# Patient Record
Sex: Male | Born: 1950 | Race: White | Hispanic: No | Marital: Married | Smoking: Current every day smoker
Health system: Southern US, Community
[De-identification: ages and names within clinical notes are randomized; demographics above are authoritative.]

## PROBLEM LIST (undated history)

## (undated) DIAGNOSIS — I4510 Unspecified right bundle-branch block: Secondary | ICD-10-CM

## (undated) DIAGNOSIS — E7800 Pure hypercholesterolemia, unspecified: Secondary | ICD-10-CM

## (undated) DIAGNOSIS — Z972 Presence of dental prosthetic device (complete) (partial): Secondary | ICD-10-CM

## (undated) DIAGNOSIS — K8020 Calculus of gallbladder without cholecystitis without obstruction: Secondary | ICD-10-CM

## (undated) DIAGNOSIS — K219 Gastro-esophageal reflux disease without esophagitis: Secondary | ICD-10-CM

## (undated) HISTORY — PX: THORACIC DISC SURGERY: SHX801

---

## 2005-03-25 ENCOUNTER — Encounter: Admission: RE | Admit: 2005-03-25 | Discharge: 2005-03-25 | Payer: Self-pay | Admitting: Internal Medicine

## 2011-04-03 ENCOUNTER — Other Ambulatory Visit: Payer: Self-pay | Admitting: Cardiology

## 2011-04-03 ENCOUNTER — Ambulatory Visit
Admission: RE | Admit: 2011-04-03 | Discharge: 2011-04-03 | Disposition: A | Discharge status: Home / Self Care | Payer: BC Managed Care – PPO | Source: Ambulatory Visit | Attending: Cardiology | Admitting: Cardiology

## 2014-02-08 ENCOUNTER — Other Ambulatory Visit: Payer: Self-pay | Admitting: Family Medicine

## 2014-02-08 DIAGNOSIS — M545 Low back pain: Secondary | ICD-10-CM

## 2014-02-19 ENCOUNTER — Ambulatory Visit
Admission: RE | Admit: 2014-02-19 | Discharge: 2014-02-19 | Disposition: A | Discharge status: Home / Self Care | Payer: BLUE CROSS/BLUE SHIELD | Source: Ambulatory Visit | Attending: Family Medicine | Admitting: Family Medicine

## 2014-02-19 DIAGNOSIS — M545 Low back pain: Secondary | ICD-10-CM

## 2014-12-11 ENCOUNTER — Emergency Department (HOSPITAL_BASED_OUTPATIENT_CLINIC_OR_DEPARTMENT_OTHER): Payer: BLUE CROSS/BLUE SHIELD

## 2014-12-11 ENCOUNTER — Emergency Department (HOSPITAL_BASED_OUTPATIENT_CLINIC_OR_DEPARTMENT_OTHER)
Admission: EM | Admit: 2014-12-11 | Discharge: 2014-12-11 | Disposition: A | Discharge status: Home / Self Care | Payer: BLUE CROSS/BLUE SHIELD | Attending: Emergency Medicine | Admitting: Emergency Medicine

## 2014-12-11 ENCOUNTER — Encounter (HOSPITAL_BASED_OUTPATIENT_CLINIC_OR_DEPARTMENT_OTHER): Payer: Self-pay | Admitting: Emergency Medicine

## 2014-12-11 DIAGNOSIS — Z8679 Personal history of other diseases of the circulatory system: Secondary | ICD-10-CM | POA: Insufficient documentation

## 2014-12-11 DIAGNOSIS — R509 Fever, unspecified: Secondary | ICD-10-CM | POA: Diagnosis not present

## 2014-12-11 DIAGNOSIS — R079 Chest pain, unspecified: Secondary | ICD-10-CM | POA: Diagnosis present

## 2014-12-11 DIAGNOSIS — R42 Dizziness and giddiness: Secondary | ICD-10-CM | POA: Diagnosis not present

## 2014-12-11 DIAGNOSIS — Z79899 Other long term (current) drug therapy: Secondary | ICD-10-CM | POA: Insufficient documentation

## 2014-12-11 DIAGNOSIS — F17200 Nicotine dependence, unspecified, uncomplicated: Secondary | ICD-10-CM | POA: Insufficient documentation

## 2014-12-11 DIAGNOSIS — E7800 Pure hypercholesterolemia, unspecified: Secondary | ICD-10-CM | POA: Diagnosis not present

## 2014-12-11 DIAGNOSIS — R000 Tachycardia, unspecified: Secondary | ICD-10-CM | POA: Insufficient documentation

## 2014-12-11 DIAGNOSIS — R0789 Other chest pain: Secondary | ICD-10-CM | POA: Insufficient documentation

## 2014-12-11 HISTORY — DX: Unspecified right bundle-branch block: I45.10

## 2014-12-11 HISTORY — DX: Pure hypercholesterolemia, unspecified: E78.00

## 2014-12-11 LAB — CBC WITH DIFFERENTIAL/PLATELET
BASOS ABS: 0.2 10*3/uL — AB (ref 0.0–0.1)
Basophils Relative: 1 %
Eosinophils Absolute: 0.1 10*3/uL (ref 0.0–0.7)
Eosinophils Relative: 1 %
HEMATOCRIT: 50.7 % (ref 39.0–52.0)
HEMOGLOBIN: 16.8 g/dL (ref 13.0–17.0)
LYMPHS ABS: 0.9 10*3/uL (ref 0.7–4.0)
LYMPHS PCT: 7 %
MCH: 30.3 pg (ref 26.0–34.0)
MCHC: 33.1 g/dL (ref 30.0–36.0)
MCV: 91.5 fL (ref 78.0–100.0)
MONO ABS: 1.3 10*3/uL — AB (ref 0.1–1.0)
Monocytes Relative: 10 %
NEUTROS ABS: 10.8 10*3/uL — AB (ref 1.7–7.7)
Neutrophils Relative %: 82 %
Platelets: 205 10*3/uL (ref 150–400)
RBC: 5.54 MIL/uL (ref 4.22–5.81)
RDW: 13.7 % (ref 11.5–15.5)
WBC: 13.2 10*3/uL — ABNORMAL HIGH (ref 4.0–10.5)

## 2014-12-11 LAB — URINALYSIS, ROUTINE W REFLEX MICROSCOPIC
BILIRUBIN URINE: NEGATIVE
GLUCOSE, UA: 100 mg/dL — AB
HGB URINE DIPSTICK: NEGATIVE
KETONES UR: NEGATIVE mg/dL
Leukocytes, UA: NEGATIVE
Nitrite: NEGATIVE
PH: 6 (ref 5.0–8.0)
Protein, ur: NEGATIVE mg/dL
Specific Gravity, Urine: 1.019 (ref 1.005–1.030)

## 2014-12-11 LAB — COMPREHENSIVE METABOLIC PANEL
ALBUMIN: 4.4 g/dL (ref 3.5–5.0)
ALK PHOS: 89 U/L (ref 38–126)
ALT: 24 U/L (ref 17–63)
ANION GAP: 9 (ref 5–15)
AST: 26 U/L (ref 15–41)
BILIRUBIN TOTAL: 0.9 mg/dL (ref 0.3–1.2)
BUN: 15 mg/dL (ref 6–20)
CALCIUM: 9.8 mg/dL (ref 8.9–10.3)
CO2: 27 mmol/L (ref 22–32)
Chloride: 101 mmol/L (ref 101–111)
Creatinine, Ser: 1.29 mg/dL — ABNORMAL HIGH (ref 0.61–1.24)
GFR calc Af Amer: >60 mL/min (ref >60)
GFR calc non Af Amer: 57 mL/min — ABNORMAL LOW (ref >60)
GLUCOSE: 113 mg/dL — AB (ref 65–99)
Potassium: 4.1 mmol/L (ref 3.5–5.1)
Sodium: 137 mmol/L (ref 135–145)
TOTAL PROTEIN: 7.9 g/dL (ref 6.5–8.1)

## 2014-12-11 LAB — TROPONIN I
Troponin I: 0.09 ng/mL — ABNORMAL HIGH (ref <0.031)
Troponin I: <0.03 ng/mL (ref <0.031)

## 2014-12-11 LAB — CK: Total CK: 217 U/L (ref 49–397)

## 2014-12-11 LAB — D-DIMER, QUANTITATIVE: D-Dimer, Quant: 0.38 ug/mL-FEU (ref 0.00–0.50)

## 2014-12-11 LAB — LIPASE, BLOOD: Lipase: 21 U/L (ref 11–51)

## 2014-12-11 LAB — I-STAT CG4 LACTIC ACID, ED: Lactic Acid, Venous: 1.71 mmol/L (ref 0.5–2.0)

## 2014-12-11 MED ORDER — HYDROCODONE-ACETAMINOPHEN 5-325 MG PO TABS: 1.0000 | ORAL_TABLET | ORAL | Status: DC | PRN

## 2014-12-11 MED ORDER — ASPIRIN 81 MG PO CHEW
324.0000 mg | CHEWABLE_TABLET | Freq: Once | ORAL | Status: DC
  Filled 2014-12-11 (×2): qty 4

## 2014-12-11 MED ORDER — ASPIRIN 81 MG PO CHEW
162.0000 mg | CHEWABLE_TABLET | Freq: Once | ORAL | Status: AC
  Administered 2014-12-11: 162 mg via ORAL

## 2014-12-11 MED ORDER — SODIUM CHLORIDE 0.9 % IV BOLUS (SEPSIS)
1000.0000 mL | Freq: Once | INTRAVENOUS | Status: AC
  Administered 2014-12-11: 1000 mL via INTRAVENOUS

## 2014-12-11 MED ORDER — IBUPROFEN 600 MG PO TABS: 600.0000 mg | ORAL_TABLET | Freq: Three times a day (TID) | ORAL | Status: DC

## 2014-12-11 MED ORDER — ACETAMINOPHEN 325 MG PO TABS
650.0000 mg | ORAL_TABLET | ORAL | Status: DC | PRN
  Administered 2014-12-11: 650 mg via ORAL
  Filled 2014-12-11: qty 2

## 2014-12-11 NOTE — ED Notes (Signed)
Patient reports that he started to have a burning sensation to his mid sternal area last night that was better when he drank any liquid. The patient reports that he also has a fever that has started today.

## 2014-12-11 NOTE — ED Provider Notes (Signed)
CSN: [id number]     Arrival date & time 12/11/14  [id number] History  By signing my name below, I, Adrian Vincent, attest that this documentation has been prepared under the direction and in the presence of Loren Raceravid Arcola Freshour, MD. Electronically Signed: Octavia HeirArianna Vincent, ED Scribe. 12/11/2014. 7:40 PM.     Chief Complaint  Patient presents with  . Chest Pain     The history is provided by the patient. No language interpreter was used.   HPI Comments: Adrian Vincent is a 64 y.o. male who has a hx of RBBB and hypercholesteremia presents to the Emergency Department complaining of  gradual onset inferior pain starting yesterday. The pain does not radiate. He states the pain increases when he sits up and and with movement. Pt notes that laying down alleviates his pain minimally. Pt states having similar pain about 18 years ago and was told his sternum was bruised. Patient began running fever today. Denies cough or shortness of breath. Denies abdominal pain, vomiting, diarrhea. No URI symptoms. No neck pain or stiffness. No new lower extremity swelling or pain. Has relative with URI symptoms.  Past Medical History  Diagnosis Date  . RBBB   . Hypercholesteremia    Past Surgical History  Procedure Laterality Date  . Back surgery     History reviewed. No pertinent family history. Social History  Substance Use Topics  . Smoking status: Current Every Day Smoker  . Smokeless tobacco: None  . Alcohol Use: Yes     Comment: occ    Review of Systems  Constitutional: Positive for fever. Negative for chills, activity change, appetite change and fatigue.  HENT: Negative for congestion, rhinorrhea and sinus pressure.   Eyes: Negative for redness and visual disturbance.  Respiratory: Negative for cough, shortness of breath and wheezing.   Cardiovascular: Positive for chest pain. Negative for palpitations and leg swelling.  Gastrointestinal: Negative for nausea, vomiting, abdominal pain and diarrhea.   Genitourinary: Negative for dysuria, frequency, hematuria and flank pain.  Musculoskeletal: Negative for myalgias, back pain, joint swelling, neck pain and neck stiffness.  Skin: Negative for rash and wound.  Neurological: Positive for light-headedness. Negative for dizziness, syncope, weakness, numbness and headaches.  All other systems reviewed and are negative.     Allergies  Iodine  Home Medications   Prior to Admission medications   Medication Sig Start Date End Date Taking? Authorizing Provider  atorvastatin (LIPITOR) 40 MG tablet Take 40 mg by mouth daily.   Yes Historical Provider, MD  RABEprazole (ACIPHEX) 20 MG tablet Take 20 mg by mouth daily.   Yes Historical Provider, MD  HYDROcodone-acetaminophen (NORCO) 5-325 MG tablet Take 1-2 tablets by mouth every 4 (four) hours as needed for severe pain. 12/11/14   Loren Raceravid [employer], MD  ibuprofen (ADVIL,MOTRIN) 600 MG tablet Take 1 tablet (600 mg total) by mouth 3 (three) times daily after meals. 12/11/14   Loren Raceravid Osiris Charles, MD   Triage vitals: BP 163/95 mmHg  Pulse 111  Temp(Src) 101.1 F (38.4 C) (Oral)  Resp 20  Ht 6\' 4"  (1.93 m)  Wt 225 lb (102.059 kg)  BMI 27.40 kg/m2  SpO2 95% Physical Exam  Constitutional: He is oriented to person, place, and time. He appears well-developed and well-nourished. No distress.  HENT:  Head: Normocephalic and atraumatic.  Mouth/Throat: Oropharynx is clear and moist. No oropharyngeal exudate.  Uvula is midline. No tonsillar exudate. No sinus tenderness to percussion.  Eyes: EOM are normal. Pupils are equal, round, and reactive to light.  Right eye exhibits no discharge. Left eye exhibits no discharge.  Neck: Normal range of motion. Neck supple. No JVD present.  No meningismus.  Cardiovascular: Regular rhythm.  Exam reveals no gallop and no friction rub.   No murmur heard. Tachycardia  Pulmonary/Chest: Effort normal and breath sounds normal. No respiratory distress. He has no wheezes. He has  no rales. He exhibits tenderness (patient is very mild tenderness over the xiphoid process. No obvious injury.).  Abdominal: Soft. Bowel sounds are normal. He exhibits no distension and no mass. There is no tenderness. There is no rebound and no guarding.  Musculoskeletal: Normal range of motion. He exhibits no edema or tenderness.  No CVA tenderness bilaterally. No lower extremity swelling or pain. Distal pulses 2+ and equal in all extremities.  Lymphadenopathy:    He has no cervical adenopathy.  Neurological: He is alert and oriented to person, place, and time.  5/5 motor in all extremities. Sensation is fully intact.  Skin: Skin is warm and dry. No rash noted. No erythema.  Psychiatric: He has a normal mood and affect. His behavior is normal.  Nursing note and vitals reviewed.   ED Course  Procedures  DIAGNOSTIC STUDIES: Oxygen Saturation is 95% on RA, adequate by my interpretation.  COORDINATION OF CARE:  7:41 PM Discussed treatment plan with pt at bedside and pt agreed to plan.  Labs Review Labs Reviewed  CBC WITH DIFFERENTIAL/PLATELET - Abnormal; Notable for the following:    WBC 13.2 (*)    Neutro Abs 10.8 (*)    Monocytes Absolute 1.3 (*)    Basophils Absolute 0.2 (*)    All other components within normal limits  COMPREHENSIVE METABOLIC PANEL - Abnormal; Notable for the following:    Glucose, Bld 113 (*)    Creatinine, Ser 1.29 (*)    GFR calc non Af Amer 57 (*)    All other components within normal limits  TROPONIN I - Abnormal; Notable for the following:    Troponin I 0.09 (*)    All other components within normal limits  URINALYSIS, ROUTINE W REFLEX MICROSCOPIC (NOT AT [facility]) - Abnormal; Notable for the following:    Glucose, UA 100 (*)    All other components within normal limits  CULTURE, BLOOD (ROUTINE X 2)  CULTURE, BLOOD (ROUTINE X 2)  CK  LIPASE, BLOOD  D-DIMER, QUANTITATIVE (NOT AT [facility])  TROPONIN I  I-STAT CG4 LACTIC ACID, ED    Imaging Review Dg  Chest 2 View  12/11/2014  CLINICAL DATA:  Chest pain since yesterday. EXAM: CHEST  2 VIEW COMPARISON:  04/03/2011. FINDINGS: Normal sized heart. Clear lungs. Lower thoracic spine degenerative changes. IMPRESSION: No acute abnormality. Electronically Signed   By: Beckie Salts M.D.   On: 12/11/2014 20:42   I have personally reviewed and evaluated these images and lab results as part of my medical decision-making.   EKG Interpretation   Date/Time:  Sunday December 11 2014 19:27:04 EST Ventricular Rate:  109 PR Interval:  152 QRS Duration: 114 QT Interval:  334 QTC Calculation: 449 R Axis:   81 Text Interpretation:  Sinus tachycardia Right bundle branch block Abnormal  ECG Confirmed by Greycen Felter  MD, Jontez Redfield ([id number]) on 12/11/2014 7:43:31 PM      MDM   Final diagnoses:  Atypical chest pain  Fever, unspecified fever cause    I personally performed the services described in this documentation, which was scribed in my presence. The recorded information has been reviewed and is accurate.  Bedside ultrasound reveals trace pericardial effusion. Patient states that his chest pain has improved significantly. States it now rates 2/10. Patient took 162 mg of aspirin prior to arrival. Was given another 162 in the emergency department. Patient has an elevated white blood cell count. Normal lactic acid. Troponin is mildly elevated at 0.09. Chest x-ray without any acute findings. Suspect pericarditis versus myocarditis. We'll discuss with the patient's cardiologist Dr. Jacinto Halim.  Dr Jacinto Halim suggest repeat troponin and if not significantly elevated charging the patient follow-up with him in his office tomorrow. Patient continues to be well-appearing. Chest pain is very atypical for coronary artery disease. It is positional and associated with fever. Pain has been constant for greater than 24 hours. No significant elevation in initial troponin.  Repeat troponin is normal. Normal d-dimer. We'll discharge home  with NSAIDs. Patient stands the need to follow-up with Dr.Ganji in the morning. He can given return precautions and has voiced understanding.  Loren Racer, MD 12/11/14 [id number]

## 2014-12-11 NOTE — Discharge Instructions (Signed)
Call and make an appointment to follow-up with Dr. Jacinto Halim. Take medication as prescribed. Return immediately for worsening pain, difficulty breathing or new concerns.  Pericarditis Pericarditis is swelling (inflammation) of the pericardium. The pericardium is a thin, double-layered, fluid-filled tissue sac that surrounds the heart. The purpose of the pericardium is to contain the heart in the chest cavity and keep the heart from overexpanding. Different types of pericarditis can occur, such as:  Acute pericarditis. Inflammation can develop suddenly in acute pericarditis.  Chronic pericarditis. Inflammation develops gradually and is long-lasting in chronic pericarditis.  Constrictive pericarditis. In this type of pericarditis, the layers of the pericardium stiffen and develop scar tissue. The scar tissue thickens and sticks together. This makes it difficult for the heart to pump and work as it normally does. CAUSES  Pericarditis can be caused from different conditions, such as:  A bacterial, fungal or viral infection.  After a heart attack (myocardial infarction).  After open-heart surgery (coronary bypass graft surgery).  Auto-immune conditions such as lupus, rheumatoid arthritis or scleroderma.  Kidney failure.  Low thyroid condition (hypothyroidism).  Cancer from another part of the body that has spread (metastasized) to the pericardium.  Chest injury or trauma.  After radiation treatment.  Certain medicines. SYMPTOMS  Symptoms of pericarditis can include:  Chest pain. Chest pain symptoms may increase when laying down and may be relieved when sitting up and leaning forward.  A chronic, dry cough.  Heart palpitations. These may feel like rapid, fluttering or pounding heart beats.  Chest pain may be worse when swallowing.  Dizziness or fainting.  Tiredness, fatigue or lethargy.  Fever. DIAGNOSIS  Pericarditis is diagnosed by the following:  A physical exam. A heart  sound called a pericardial friction rub may be heard when your caregiver listens to your heart.  Blood work. Blood may be drawn to check for an infection and to look at your blood chemistry.  Electrocardiography. During electrocardiography your heart's electrical activity is monitored and recorded with a tracing on paper (electrocardiogram [ECG]).  Echocardiography.  Computed tomography (CT).  Magnetic resonance image (MRI). TREATMENT  To treat pericarditis, it is important to know the cause of it. The cause of pericarditis determines the treatment.   If the cause of pericarditis is due to an infection, treatment is based on the type of infection. If an infection is suspected in the pericardial fluid, a procedure called a pericardial fluid culture and biopsy may be done. This takes a sample of the pericardial fluid. The sample is sent to a lab which runs tests on the pericardial fluid to check for an infection.  If the autoimmune disease is the cause, treatment of the autoimmune condition will help improve the pericarditis.  If the cause of pericarditis is not known, anti-inflammatory medicines may be used to help decrease the inflammation.  Surgery may be needed. The following are types of surgeries or procedures that may be done to treat pericarditis:  Pericardial window. A pericardial window makes a cut (incision) into the pericardial sac. This allows excess fluid in the pericardium to drain.  Pericardiocentesis. A pericardiocentesis is also known as a pericardial tap. This procedure uses a needle that is guided by X-ray to drain (aspirate) excess fluid from the pericardium.  Pericardiectomy. A pericardiectomy removes part or all of the pericardium. HOME CARE INSTRUCTIONS   Do not smoke. If you smoke, quit. Your caregiver can help you quit smoking.  Maintain a healthy weight.  Follow an exercise program as directed by  your health care provider. You may need to limit your  exercising until your symptoms go away.  If you drink alcohol, do so in moderation.  Eat a heart healthy diet. A registered dietitian can help you learn about healthy food choices.  Keep a list of all your medicines with you at all times. Include the name, dose, how often it is taken and how it is taken. SEEK IMMEDIATE MEDICAL CARE IF:   You have chest pain or feelings of chest pressure.  You have sweating (diaphoresis) when at rest.  You have irregular heartbeats (palpitations).  You have rapid, racing heart beats.  You have unexplained fainting episodes.  You feel sick to your stomach (nausea) or vomiting without cause.  You have unexplained weakness. If you develop any of the symptoms which originally made you seek care, call for local emergency medical help. Do not drive yourself to the hospital.   This information is not intended to replace advice given to you by your health care provider. Make sure you discuss any questions you have with your health care provider.   Document Released: 06/19/2000 Document Revised: 05/10/2014 Document Reviewed: 07/06/2014 Elsevier Interactive Patient Education [employer]2016 Elsevier Inc.

## 2014-12-17 LAB — CULTURE, BLOOD (ROUTINE X 2)
CULTURE: NO GROWTH
CULTURE: NO GROWTH

## 2015-06-15 ENCOUNTER — Other Ambulatory Visit: Payer: Self-pay | Admitting: Gastroenterology

## 2015-06-15 DIAGNOSIS — R1013 Epigastric pain: Secondary | ICD-10-CM

## 2015-06-28 ENCOUNTER — Encounter (HOSPITAL_COMMUNITY)
Admission: RE | Admit: 2015-06-28 | Discharge: 2015-06-28 | Disposition: A | Discharge status: Home / Self Care | Payer: BLUE CROSS/BLUE SHIELD | Source: Ambulatory Visit | Attending: Gastroenterology | Admitting: Gastroenterology

## 2015-06-28 DIAGNOSIS — R1013 Epigastric pain: Secondary | ICD-10-CM | POA: Diagnosis not present

## 2015-07-24 ENCOUNTER — Other Ambulatory Visit: Payer: Self-pay | Admitting: Surgery

## 2015-08-08 DIAGNOSIS — K8020 Calculus of gallbladder without cholecystitis without obstruction: Secondary | ICD-10-CM

## 2015-08-08 HISTORY — DX: Calculus of gallbladder without cholecystitis without obstruction: K80.20

## 2015-08-11 ENCOUNTER — Encounter (HOSPITAL_BASED_OUTPATIENT_CLINIC_OR_DEPARTMENT_OTHER): Payer: Self-pay | Admitting: *Deleted

## 2015-08-11 NOTE — Pre-Procedure Instructions (Signed)
Office note and cardiac testing req. from Dr. Jodi Marble office.

## 2015-08-15 NOTE — H&P (Signed)
Adrian JanskyJack W. Malphrus 07/24/2015 3:56 PM Location: [facility] Patient #: [id number] DOB: 03/18/1950 Married / Language: [place] / Race: White Male   History of Present Illness (Adrian Vincent A. Magnus IvanBlackman MD; 07/24/2015 4:42 PM) Patient words: New-Gallbladder.  The patient is a 65 year old male who presents for evaluation of gall stones. This is a pleasant gentleman referred to me by Dr. Charna ElizabethJyothi Vincent for evaluation of symptomatic cholelithiasis. The patient actually started having symptoms in December 2016. He had presented to emergency department with severe epigastric discomfort hurting into the chest. His cardiac workup was negative. He has since seen Dr. Loreta Vincent. He has had an unremarkable upper and lower endoscopy. He has had an ultrasound showing cholelithiasis with gallbladder sludge and a thickened gallbladder wall. The bile duct is normal. He reports of the attacks occur after fatty food. It occasionally goes to the right chest as well. He really is otherwise without symptoms. The pain is moderate at times.   Allergies [name]([name], CMA; 07/24/2015 3:58 PM) Amino Acid *NUTRIENTS* Iodine (Antiseptic) *ANTISEPTICS & DISINFECTANTS*  Medication History ([name], CMA; 07/24/2015 3:59 PM) Atorvastatin Calcium (40MG  Tablet, Oral) Active. Omeprazole (40MG  Capsule DR, Oral) Active. Aspirin (325MG  Tablet, Oral) Active. Medications Reconciled    Review of Systems [name]([name] CMA; 07/24/2015 3:56 PM) General Present- Fatigue and Night Sweats. Not Present- Appetite Loss, Chills, Fever, Weight Gain and Weight Loss. Skin Not Present- Change in Wart/Mole, Dryness, Hives, Jaundice, New Lesions, Non-Healing Wounds, Rash and Ulcer. HEENT Present- Wears glasses/contact lenses. Not Present- Earache, Hearing Loss, Hoarseness, Nose Bleed, Oral Ulcers, Ringing in the Ears, Seasonal Allergies, Sinus Pain, Sore Throat, Visual Disturbances and Yellow Eyes. Respiratory Not Present- Bloody  sputum, Chronic Cough, Difficulty Breathing, Snoring and Wheezing. Breast Not Present- Breast Mass, Breast Pain, Nipple Discharge and Skin Changes. Cardiovascular Present- Chest Pain. Not Present- Difficulty Breathing Lying Down, Leg Cramps, Palpitations, Rapid Heart Rate, Shortness of Breath and Swelling of Extremities. Gastrointestinal Present- Gets full quickly at meals. Not Present- Abdominal Pain, Bloating, Bloody Stool, Change in Bowel Habits, Chronic diarrhea, Constipation, Difficulty Swallowing, Excessive gas, Hemorrhoids, Indigestion, Nausea, Rectal Pain and Vomiting. Male Genitourinary Present- Impotence and Nocturia. Not Present- Blood in Urine, Change in Urinary Stream, Frequency, Painful Urination, Urgency and Urine Leakage. Musculoskeletal Present- Back Pain and Muscle Pain. Not Present- Joint Pain, Joint Stiffness, Muscle Weakness and Swelling of Extremities. Neurological Present- Weakness. Not Present- Decreased Memory, Fainting, Headaches, Numbness, Seizures, Tingling, Tremor and Trouble walking. Psychiatric Not Present- Anxiety, Bipolar, Change in Sleep Pattern, Depression, Fearful and Frequent crying. Endocrine Not Present- Cold Intolerance, Excessive Hunger, Hair Changes, Heat Intolerance, Hot flashes and New Diabetes. Hematology Present- Easy Bruising. Not Present- Blood Thinners, Excessive bleeding, Gland problems, HIV and Persistent Infections.  Vitals ([name] CMA; 07/24/2015 3:57 PM) 07/24/2015 3:57 PM Weight: 215.2 lb Height: 75.5in Body Surface Area: 2.27 m Body Mass Index: 26.54 kg/m  Temp.: 97.44F(Oral)  Pulse: 76 (Regular)  BP: 134/82 (Sitting, Left Arm, Standard)       Physical Exam (Adrian Vincent A. Magnus IvanBlackman MD; 07/24/2015 4:42 PM) General Mental Status-Alert. General Appearance-Consistent with stated age. Hydration-Well hydrated. Voice-Normal.  Head and Neck Head-normocephalic, atraumatic with no lesions or palpable  masses.  Eye Eyeball - Bilateral-Extraocular movements intact. Sclera/Conjunctiva - Bilateral-No scleral icterus.  Chest and Lung Exam Chest and lung exam reveals -quiet, even and easy respiratory effort with no use of accessory muscles and on auscultation, normal breath sounds, no adventitious sounds and normal vocal resonance. Inspection Chest Wall - Normal. Back -  normal.  Cardiovascular Cardiovascular examination reveals -on palpation PMI is normal in location and amplitude, no palpable S3 or S4. Normal cardiac borders., normal heart sounds, regular rate and rhythm with no murmurs, carotid auscultation reveals no bruits and normal pedal pulses bilaterally.  Abdomen Inspection Inspection of the abdomen reveals - No Hernias. Skin - Scar - no surgical scars. Palpation/Percussion Palpation and Percussion of the abdomen reveal - Soft, Non Tender, No Rebound tenderness, No Rigidity (guarding) and No hepatosplenomegaly. Auscultation Auscultation of the abdomen reveals - Bowel sounds normal. Note: He does have a rectus diastases as well as a very small umbilical hernia   Neurologic - Did not examine.  Musculoskeletal - Did not examine.    Assessment & Plan (Sharonlee Nine A. Magnus Ivan MD; 07/24/2015 4:43 PM) SYMPTOMATIC CHOLELITHIASIS (K80.20) Impression: I discussed the diagnosis with him in detail. Laparoscopic cholecystectomy is recommended. I gave him literature regarding surgery. I discussed the risk of surgery which includes but is not limited to bleeding, infection, bile duct injury, bile leak, injury to other structures, cardiopulmonary issues, DVT, postoperative recovery, etc. He understands and wished to proceed with surgery.

## 2015-08-16 ENCOUNTER — Ambulatory Visit (HOSPITAL_BASED_OUTPATIENT_CLINIC_OR_DEPARTMENT_OTHER): Payer: BLUE CROSS/BLUE SHIELD | Admitting: Anesthesiology

## 2015-08-16 ENCOUNTER — Encounter (HOSPITAL_BASED_OUTPATIENT_CLINIC_OR_DEPARTMENT_OTHER): Payer: Self-pay | Admitting: *Deleted

## 2015-08-16 ENCOUNTER — Encounter (HOSPITAL_BASED_OUTPATIENT_CLINIC_OR_DEPARTMENT_OTHER)
Admission: RE | Disposition: A | Discharge status: Home / Self Care | Payer: Self-pay | Source: Ambulatory Visit | Attending: Surgery

## 2015-08-16 ENCOUNTER — Ambulatory Visit (HOSPITAL_BASED_OUTPATIENT_CLINIC_OR_DEPARTMENT_OTHER)
Admission: RE | Admit: 2015-08-16 | Discharge: 2015-08-16 | Disposition: A | Discharge status: Home / Self Care | Payer: BLUE CROSS/BLUE SHIELD | Source: Ambulatory Visit | Attending: Surgery | Admitting: Surgery

## 2015-08-16 DIAGNOSIS — Z972 Presence of dental prosthetic device (complete) (partial): Secondary | ICD-10-CM

## 2015-08-16 DIAGNOSIS — F17210 Nicotine dependence, cigarettes, uncomplicated: Secondary | ICD-10-CM | POA: Diagnosis present

## 2015-08-16 DIAGNOSIS — I4510 Unspecified right bundle-branch block: Secondary | ICD-10-CM | POA: Diagnosis present

## 2015-08-16 DIAGNOSIS — Z91041 Radiographic dye allergy status: Secondary | ICD-10-CM

## 2015-08-16 DIAGNOSIS — Z7982 Long term (current) use of aspirin: Secondary | ICD-10-CM

## 2015-08-16 DIAGNOSIS — K819 Cholecystitis, unspecified: Secondary | ICD-10-CM | POA: Diagnosis not present

## 2015-08-16 DIAGNOSIS — F17200 Nicotine dependence, unspecified, uncomplicated: Secondary | ICD-10-CM

## 2015-08-16 DIAGNOSIS — K660 Peritoneal adhesions (postprocedural) (postinfection): Secondary | ICD-10-CM | POA: Diagnosis present

## 2015-08-16 DIAGNOSIS — K219 Gastro-esophageal reflux disease without esophagitis: Secondary | ICD-10-CM

## 2015-08-16 DIAGNOSIS — E872 Acidosis: Secondary | ICD-10-CM | POA: Diagnosis present

## 2015-08-16 DIAGNOSIS — K8010 Calculus of gallbladder with chronic cholecystitis without obstruction: Secondary | ICD-10-CM

## 2015-08-16 HISTORY — DX: Calculus of gallbladder without cholecystitis without obstruction: K80.20

## 2015-08-16 HISTORY — PX: LAPAROSCOPY: SHX197

## 2015-08-16 HISTORY — DX: Presence of dental prosthetic device (complete) (partial): Z97.2

## 2015-08-16 HISTORY — DX: Gastro-esophageal reflux disease without esophagitis: K21.9

## 2015-08-16 SURGERY — LAPAROSCOPY, DIAGNOSTIC
Anesthesia: General | Site: Abdomen

## 2015-08-16 MED ORDER — DEXAMETHASONE SODIUM PHOSPHATE 10 MG/ML IJ SOLN
INTRAMUSCULAR | Status: AC
  Filled 2015-08-16: qty 1

## 2015-08-16 MED ORDER — KETOROLAC TROMETHAMINE 30 MG/ML IJ SOLN
INTRAMUSCULAR | Status: DC | PRN
  Administered 2015-08-16: 30 mg via INTRAVENOUS

## 2015-08-16 MED ORDER — CHLORHEXIDINE GLUCONATE CLOTH 2 % EX PADS: 6.0000 | MEDICATED_PAD | Freq: Once | CUTANEOUS | Status: DC

## 2015-08-16 MED ORDER — FENTANYL CITRATE (PF) 100 MCG/2ML IJ SOLN
50.0000 ug | INTRAMUSCULAR | Status: DC | PRN
  Administered 2015-08-16: 100 ug via INTRAVENOUS

## 2015-08-16 MED ORDER — CEFAZOLIN SODIUM-DEXTROSE 2-4 GM/100ML-% IV SOLN
2.0000 g | INTRAVENOUS | Status: AC
  Administered 2015-08-16: 2 g via INTRAVENOUS

## 2015-08-16 MED ORDER — FENTANYL CITRATE (PF) 100 MCG/2ML IJ SOLN
INTRAMUSCULAR | Status: AC
  Filled 2015-08-16: qty 2

## 2015-08-16 MED ORDER — ROCURONIUM BROMIDE 10 MG/ML (PF) SYRINGE
PREFILLED_SYRINGE | INTRAVENOUS | Status: AC
  Filled 2015-08-16: qty 10

## 2015-08-16 MED ORDER — ONDANSETRON HCL 4 MG/2ML IJ SOLN: 4.0000 mg | Freq: Once | INTRAMUSCULAR | Status: DC | PRN

## 2015-08-16 MED ORDER — HYDROMORPHONE HCL 1 MG/ML IJ SOLN: 0.2500 mg | INTRAMUSCULAR | Status: DC | PRN

## 2015-08-16 MED ORDER — CIPROFLOXACIN HCL 500 MG PO TABS: 500.0000 mg | ORAL_TABLET | Freq: Two times a day (BID) | ORAL | 0 refills | Status: AC

## 2015-08-16 MED ORDER — MIDAZOLAM HCL 2 MG/2ML IJ SOLN
1.0000 mg | INTRAMUSCULAR | Status: DC | PRN
  Administered 2015-08-16: 2 mg via INTRAVENOUS

## 2015-08-16 MED ORDER — CEFAZOLIN SODIUM-DEXTROSE 2-4 GM/100ML-% IV SOLN
INTRAVENOUS | Status: AC
  Filled 2015-08-16: qty 100

## 2015-08-16 MED ORDER — BUPIVACAINE-EPINEPHRINE (PF) 0.5% -1:200000 IJ SOLN
INTRAMUSCULAR | Status: DC | PRN
  Administered 2015-08-16: 20 mL

## 2015-08-16 MED ORDER — SUGAMMADEX SODIUM 500 MG/5ML IV SOLN
INTRAVENOUS | Status: AC
  Filled 2015-08-16: qty 5

## 2015-08-16 MED ORDER — OXYCODONE-ACETAMINOPHEN 5-325 MG PO TABS: 1.0000 | ORAL_TABLET | ORAL | 0 refills | Status: DC | PRN

## 2015-08-16 MED ORDER — SUGAMMADEX SODIUM 200 MG/2ML IV SOLN
INTRAVENOUS | Status: AC
  Filled 2015-08-16: qty 2

## 2015-08-16 MED ORDER — MIDAZOLAM HCL 2 MG/2ML IJ SOLN
INTRAMUSCULAR | Status: AC
  Filled 2015-08-16: qty 2

## 2015-08-16 MED ORDER — PHENYLEPHRINE 40 MCG/ML (10ML) SYRINGE FOR IV PUSH (FOR BLOOD PRESSURE SUPPORT)
PREFILLED_SYRINGE | INTRAVENOUS | Status: AC
  Filled 2015-08-16: qty 10

## 2015-08-16 MED ORDER — ONDANSETRON HCL 4 MG/2ML IJ SOLN
INTRAMUSCULAR | Status: DC | PRN
  Administered 2015-08-16: 4 mg via INTRAVENOUS

## 2015-08-16 MED ORDER — SCOPOLAMINE 1 MG/3DAYS TD PT72: 1.0000 | MEDICATED_PATCH | Freq: Once | TRANSDERMAL | Status: DC | PRN

## 2015-08-16 MED ORDER — ONDANSETRON HCL 4 MG/2ML IJ SOLN
INTRAMUSCULAR | Status: AC
  Filled 2015-08-16: qty 2

## 2015-08-16 MED ORDER — SODIUM CHLORIDE 0.9 % IR SOLN
Status: DC | PRN
  Administered 2015-08-16: 1

## 2015-08-16 MED ORDER — LIDOCAINE HCL (CARDIAC) 20 MG/ML IV SOLN
INTRAVENOUS | Status: DC | PRN
  Administered 2015-08-16: 100 mg via INTRAVENOUS

## 2015-08-16 MED ORDER — OXYCODONE HCL 5 MG PO TABS
ORAL_TABLET | ORAL | Status: AC
  Filled 2015-08-16: qty 1

## 2015-08-16 MED ORDER — SUGAMMADEX SODIUM 500 MG/5ML IV SOLN
INTRAVENOUS | Status: DC | PRN
  Administered 2015-08-16: 200 mg via INTRAVENOUS

## 2015-08-16 MED ORDER — OXYCODONE HCL 5 MG PO TABS
5.0000 mg | ORAL_TABLET | Freq: Once | ORAL | Status: AC | PRN
  Administered 2015-08-16: 5 mg via ORAL

## 2015-08-16 MED ORDER — OXYCODONE HCL 5 MG/5ML PO SOLN: 5.0000 mg | Freq: Once | ORAL | Status: AC | PRN

## 2015-08-16 MED ORDER — LACTATED RINGERS IV SOLN
INTRAVENOUS | Status: DC
  Administered 2015-08-16 (×2): via INTRAVENOUS

## 2015-08-16 MED ORDER — ROCURONIUM BROMIDE 100 MG/10ML IV SOLN
INTRAVENOUS | Status: DC | PRN
  Administered 2015-08-16: 35 mg via INTRAVENOUS

## 2015-08-16 MED ORDER — DEXAMETHASONE SODIUM PHOSPHATE 4 MG/ML IJ SOLN
INTRAMUSCULAR | Status: DC | PRN
  Administered 2015-08-16: 10 mg via INTRAVENOUS

## 2015-08-16 MED ORDER — LIDOCAINE 2% (20 MG/ML) 5 ML SYRINGE
INTRAMUSCULAR | Status: AC
  Filled 2015-08-16: qty 5

## 2015-08-16 MED ORDER — PROPOFOL 500 MG/50ML IV EMUL
INTRAVENOUS | Status: AC
  Filled 2015-08-16: qty 100

## 2015-08-16 MED ORDER — MEPERIDINE HCL 25 MG/ML IJ SOLN: 6.2500 mg | INTRAMUSCULAR | Status: DC | PRN

## 2015-08-16 MED ORDER — ESMOLOL HCL 100 MG/10ML IV SOLN
INTRAVENOUS | Status: DC | PRN
  Administered 2015-08-16: 20 mg via INTRAVENOUS

## 2015-08-16 MED ORDER — PROPOFOL 10 MG/ML IV BOLUS
INTRAVENOUS | Status: DC | PRN
  Administered 2015-08-16: 250 mg via INTRAVENOUS

## 2015-08-16 MED ORDER — GLYCOPYRROLATE 0.2 MG/ML IJ SOLN: 0.2000 mg | Freq: Once | INTRAMUSCULAR | Status: DC | PRN

## 2015-08-16 SURGICAL SUPPLY — 40 items
APPLIER CLIP 5 13 M/L LIGAMAX5 (MISCELLANEOUS) ×3
APR CLP MED LRG 5 ANG JAW (MISCELLANEOUS) ×2
BAG SPEC RTRVL LRG 6X4 10 (ENDOMECHANICALS) ×2
BLADE CLIPPER SURG (BLADE) ×2 IMPLANT
CHLORAPREP W/TINT 26ML (MISCELLANEOUS) ×3 IMPLANT
CLIP APPLIE 5 13 M/L LIGAMAX5 (MISCELLANEOUS) ×2 IMPLANT
COVER MAYO STAND STRL (DRAPES) IMPLANT
DECANTER SPIKE VIAL GLASS SM (MISCELLANEOUS) ×3 IMPLANT
DRAPE C-ARM 42X72 X-RAY (DRAPES) IMPLANT
ELECT REM PT RETURN 9FT ADLT (ELECTROSURGICAL) ×3
ELECTRODE REM PT RTRN 9FT ADLT (ELECTROSURGICAL) ×2 IMPLANT
FILTER SMOKE EVAC LAPAROSHD (FILTER) IMPLANT
GLOVE BIO SURGEON STRL SZ7 (GLOVE) ×2 IMPLANT
GLOVE BIO SURGEON STRL SZ8 (GLOVE) ×4 IMPLANT
GLOVE BIOGEL PI IND STRL 7.0 (GLOVE) ×1 IMPLANT
GLOVE BIOGEL PI IND STRL 7.5 (GLOVE) ×1 IMPLANT
GLOVE BIOGEL PI INDICATOR 7.0 (GLOVE) ×1
GLOVE BIOGEL PI INDICATOR 7.5 (GLOVE) ×1
GLOVE SURG SIGNA 7.5 PF LTX (GLOVE) ×3 IMPLANT
GOWN STRL REUS W/ TWL LRG LVL3 (GOWN DISPOSABLE) ×4 IMPLANT
GOWN STRL REUS W/ TWL XL LVL3 (GOWN DISPOSABLE) ×2 IMPLANT
GOWN STRL REUS W/TWL LRG LVL3 (GOWN DISPOSABLE) ×3
GOWN STRL REUS W/TWL XL LVL3 (GOWN DISPOSABLE) ×6
LIQUID BAND (GAUZE/BANDAGES/DRESSINGS) ×6 IMPLANT
NS IRRIG 1000ML POUR BTL (IV SOLUTION) ×3 IMPLANT
PACK BASIN DAY SURGERY FS (CUSTOM PROCEDURE TRAY) ×3 IMPLANT
POUCH SPECIMEN RETRIEVAL 10MM (ENDOMECHANICALS) ×2 IMPLANT
SCISSORS LAP 5X35 DISP (ENDOMECHANICALS) IMPLANT
SET CHOLANGIOGRAPH 5 50 .035 (SET/KITS/TRAYS/PACK) IMPLANT
SET IRRIG TUBING LAPAROSCOPIC (IRRIGATION / IRRIGATOR) ×3 IMPLANT
SLEEVE ENDOPATH XCEL 5M (ENDOMECHANICALS) ×6 IMPLANT
SLEEVE SCD COMPRESS KNEE MED (MISCELLANEOUS) ×3 IMPLANT
SPECIMEN JAR SMALL (MISCELLANEOUS) ×1 IMPLANT
SUT MON AB 4-0 PC3 18 (SUTURE) ×3 IMPLANT
TOWEL OR 17X24 6PK STRL BLUE (TOWEL DISPOSABLE) ×3 IMPLANT
TRAY LAPAROSCOPIC (CUSTOM PROCEDURE TRAY) ×3 IMPLANT
TROCAR XCEL BLUNT TIP 100MML (ENDOMECHANICALS) ×3 IMPLANT
TROCAR XCEL NON-BLD 5MMX100MML (ENDOMECHANICALS) ×3 IMPLANT
TUBE CONNECTING 20X1/4 (TUBING) ×3 IMPLANT
TUBING INSUFFLATION (TUBING) ×3 IMPLANT

## 2015-08-16 NOTE — Discharge Instructions (Signed)
No lifting more than 15 pounds for 2 weeks  I will be contacting Dr. [name] to have her schedule an endoscopy  I will be ordering a CT of the abdomen also  Ok to shower tomorrow   Post Anesthesia Home Care Instructions  Activity: Get plenty of rest for the remainder of the day. A responsible adult should stay with you for 24 hours following the procedure.  For the next 24 hours, DO NOT: -Drive a car -Advertising copywriterperate machinery -Drink alcoholic beverages -Take any medication unless instructed by your physician -Make any legal decisions or sign important papers.  Meals: Start with liquid foods such as gelatin or soup. Progress to regular foods as tolerated. Avoid greasy, spicy, heavy foods. If nausea and/or vomiting occur, drink only clear liquids until the nausea and/or vomiting subsides. Call your physician if vomiting continues.  Special Instructions/Symptoms: Your throat may feel dry or sore from the anesthesia or the breathing tube placed in your throat during surgery. If this causes discomfort, gargle with warm salt water. The discomfort should disappear within 24 hours.  If you had a scopolamine patch placed behind your ear for the management of post- operative nausea and/or vomiting:  1. The medication in the patch is effective for 72 hours, after which it should be removed.  Wrap patch in a tissue and discard in the trash. Wash hands thoroughly with soap and water. 2. You may remove the patch earlier than 72 hours if you experience unpleasant side effects which may include dry mouth, dizziness or visual disturbances. 3. Avoid touching the patch. Wash your hands with soap and water after contact with the patch.

## 2015-08-16 NOTE — Transfer of Care (Signed)
Immediate Anesthesia Transfer of Care Note  Patient: Adrian Vincent  Procedure(s) Performed: Procedure(s): DIAGNOSTIC LAPAROSCOPY (N/A)  Patient Location: PACU  Anesthesia Type:General  Level of Consciousness: sedated  Airway & Oxygen Therapy: Patient Spontanous Breathing and Patient connected to face mask oxygen  Post-op Assessment: Report given to RN and Post -op Vital signs reviewed and stable  Post vital signs: Reviewed and stable  Last Vitals:  Vitals:   08/16/15 1130  BP: (!) 148/86  Pulse: 77  Resp: 18  Temp: 36.9 C    Last Pain:  Vitals:   08/16/15 1130  TempSrc: Oral  PainSc: 2          Complications: No apparent anesthesia complications

## 2015-08-16 NOTE — Interval H&P Note (Signed)
History and Physical Interval Note:no change in H and P  08/16/2015 11:11 AM  Adrian Vincent  has presented today for surgery, with the diagnosis of Symptomatic cholelithiasis  The various methods of treatment have been discussed with the patient and family. After consideration of risks, benefits and other options for treatment, the patient has consented to  Procedure(s): LAPAROSCOPIC CHOLECYSTECTOMY (N/A) as a surgical intervention .  The patient's history has been reviewed, patient examined, no change in status, stable for surgery.  I have reviewed the patient's chart and labs.  Questions were answered to the patient's satisfaction.     [name] A

## 2015-08-16 NOTE — Anesthesia Postprocedure Evaluation (Signed)
Anesthesia Post Note  Patient: Modena JanskyJack W Ace  Procedure(s) Performed: Procedure(s) (LRB): DIAGNOSTIC LAPAROSCOPY (N/A)  Patient location during evaluation: PACU Anesthesia Type: General Level of consciousness: awake and alert Pain management: pain level controlled Vital Signs Assessment: post-procedure vital signs reviewed and stable Respiratory status: spontaneous breathing, nonlabored ventilation and respiratory function stable Cardiovascular status: blood pressure returned to baseline and stable Postop Assessment: no signs of nausea or vomiting Anesthetic complications: no    Last Vitals:  Vitals:   08/16/15 1500 08/16/15 1515  BP: (!) 141/86 (!) 150/92  Pulse: 72 67  Resp: 15 18  Temp:      Last Pain:  Vitals:   08/16/15 1530  TempSrc:   PainSc: 6                  Perina Salvaggio A

## 2015-08-16 NOTE — Op Note (Signed)
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

## 2015-08-16 NOTE — Anesthesia Procedure Notes (Signed)
Procedure Name: Intubation Date/Time: 08/16/2015 12:59 PM Performed by: Caren MacadamARTER, Adrian Walmer W Pre-anesthesia Checklist: Patient identified, Emergency Drugs available, Suction available and Patient being monitored Patient Re-evaluated:Patient Re-evaluated prior to inductionOxygen Delivery Method: Circle system utilized Preoxygenation: Pre-oxygenation with 100% oxygen Intubation Type: IV induction Ventilation: Mask ventilation without difficulty Laryngoscope Size: Miller and 2 Grade View: Grade I Tube type: Oral Tube size: 8.0 mm Number of attempts: 1 Airway Equipment and Method: Stylet and Oral airway Placement Confirmation: ETT inserted through vocal cords under direct vision,  positive ETCO2 and breath sounds checked- equal and bilateral Secured at: 25 cm Tube secured with: Tape Dental Injury: Teeth and Oropharynx as per pre-operative assessment

## 2015-08-16 NOTE — Anesthesia Preprocedure Evaluation (Signed)
Anesthesia Evaluation  Patient identified by MRN, date of birth, ID band Patient awake    Reviewed: Allergy & Precautions, NPO status , Patient's Chart, lab work & pertinent test results  Airway Mallampati: I  TM Distance: >3 FB Neck ROM: Full    Dental  (+) Teeth Intact, Dental Advisory Given   Pulmonary Current Smoker,    breath sounds clear to auscultation       Cardiovascular  Rhythm:Regular Rate:Normal     Neuro/Psych    GI/Hepatic GERD  Medicated and Controlled,  Endo/Other    Renal/GU      Musculoskeletal   Abdominal   Peds  Hematology   Anesthesia Other Findings   Reproductive/Obstetrics                             Anesthesia Physical Anesthesia Plan  ASA: I  Anesthesia Plan: General   Post-op Pain Management:    Induction: Intravenous  Airway Management Planned: Oral ETT  Additional Equipment:   Intra-op Plan:   Post-operative Plan: Extubation in OR  Informed Consent: I have reviewed the patients History and Physical, chart, labs and discussed the procedure including the risks, benefits and alternatives for the proposed anesthesia with the patient or authorized representative who has indicated his/her understanding and acceptance.   Dental advisory given  Plan Discussed with: CRNA, Anesthesiologist and Surgeon  Anesthesia Plan Comments:         Anesthesia Quick Evaluation

## 2015-08-17 ENCOUNTER — Emergency Department (HOSPITAL_COMMUNITY): Payer: BLUE CROSS/BLUE SHIELD

## 2015-08-17 ENCOUNTER — Encounter (HOSPITAL_BASED_OUTPATIENT_CLINIC_OR_DEPARTMENT_OTHER): Payer: Self-pay | Admitting: Surgery

## 2015-08-17 ENCOUNTER — Inpatient Hospital Stay (HOSPITAL_COMMUNITY)
Admission: EM | Admit: 2015-08-17 | Discharge: 2015-08-20 | DRG: 418 | Disposition: A | Discharge status: Home / Self Care | Payer: BLUE CROSS/BLUE SHIELD | Attending: Surgery | Admitting: Surgery

## 2015-08-17 DIAGNOSIS — K660 Peritoneal adhesions (postprocedural) (postinfection): Secondary | ICD-10-CM | POA: Diagnosis present

## 2015-08-17 DIAGNOSIS — E872 Acidosis, unspecified: Secondary | ICD-10-CM

## 2015-08-17 DIAGNOSIS — Z7982 Long term (current) use of aspirin: Secondary | ICD-10-CM | POA: Diagnosis not present

## 2015-08-17 DIAGNOSIS — K8010 Calculus of gallbladder with chronic cholecystitis without obstruction: Secondary | ICD-10-CM | POA: Diagnosis present

## 2015-08-17 DIAGNOSIS — D72829 Elevated white blood cell count, unspecified: Secondary | ICD-10-CM

## 2015-08-17 DIAGNOSIS — K219 Gastro-esophageal reflux disease without esophagitis: Secondary | ICD-10-CM | POA: Diagnosis present

## 2015-08-17 DIAGNOSIS — R1011 Right upper quadrant pain: Secondary | ICD-10-CM

## 2015-08-17 DIAGNOSIS — Z972 Presence of dental prosthetic device (complete) (partial): Secondary | ICD-10-CM | POA: Diagnosis not present

## 2015-08-17 DIAGNOSIS — I4510 Unspecified right bundle-branch block: Secondary | ICD-10-CM | POA: Diagnosis present

## 2015-08-17 DIAGNOSIS — K819 Cholecystitis, unspecified: Secondary | ICD-10-CM

## 2015-08-17 DIAGNOSIS — F17210 Nicotine dependence, cigarettes, uncomplicated: Secondary | ICD-10-CM | POA: Diagnosis present

## 2015-08-17 DIAGNOSIS — Z91041 Radiographic dye allergy status: Secondary | ICD-10-CM | POA: Diagnosis not present

## 2015-08-17 LAB — COMPREHENSIVE METABOLIC PANEL
ALBUMIN: 3.8 g/dL (ref 3.5–5.0)
ALT: 17 U/L (ref 17–63)
ANION GAP: 8 (ref 5–15)
AST: 21 U/L (ref 15–41)
Alkaline Phosphatase: 84 U/L (ref 38–126)
BUN: 15 mg/dL (ref 6–20)
CO2: 25 mmol/L (ref 22–32)
Calcium: 9.1 mg/dL (ref 8.9–10.3)
Chloride: 103 mmol/L (ref 101–111)
Creatinine, Ser: 1.08 mg/dL (ref 0.61–1.24)
GFR calc Af Amer: >60 mL/min (ref >60)
GFR calc non Af Amer: >60 mL/min (ref >60)
GLUCOSE: 117 mg/dL — AB (ref 65–99)
POTASSIUM: 4 mmol/L (ref 3.5–5.1)
SODIUM: 136 mmol/L (ref 135–145)
TOTAL PROTEIN: 6.7 g/dL (ref 6.5–8.1)
Total Bilirubin: 0.4 mg/dL (ref 0.3–1.2)

## 2015-08-17 LAB — URINALYSIS, ROUTINE W REFLEX MICROSCOPIC
BILIRUBIN URINE: NEGATIVE
Glucose, UA: 100 mg/dL — AB
Hgb urine dipstick: NEGATIVE
Ketones, ur: NEGATIVE mg/dL
Leukocytes, UA: NEGATIVE
NITRITE: NEGATIVE
PH: 5 (ref 5.0–8.0)
Protein, ur: NEGATIVE mg/dL
SPECIFIC GRAVITY, URINE: 1.026 (ref 1.005–1.030)

## 2015-08-17 LAB — CBC
HEMATOCRIT: 48.9 % (ref 39.0–52.0)
HEMOGLOBIN: 16.3 g/dL (ref 13.0–17.0)
MCH: 31 pg (ref 26.0–34.0)
MCHC: 33.3 g/dL (ref 30.0–36.0)
MCV: 93 fL (ref 78.0–100.0)
Platelets: 206 10*3/uL (ref 150–400)
RBC: 5.26 MIL/uL (ref 4.22–5.81)
RDW: 13.8 % (ref 11.5–15.5)
WBC: 21.3 10*3/uL — ABNORMAL HIGH (ref 4.0–10.5)

## 2015-08-17 LAB — I-STAT TROPONIN, ED: Troponin i, poc: 0.03 ng/mL (ref 0.00–0.08)

## 2015-08-17 LAB — I-STAT CG4 LACTIC ACID, ED: Lactic Acid, Venous: 2.42 mmol/L (ref 0.5–1.9)

## 2015-08-17 LAB — LIPASE, BLOOD: LIPASE: 13 U/L (ref 11–51)

## 2015-08-17 MED ORDER — HYDROMORPHONE HCL 1 MG/ML IJ SOLN
0.5000 mg | INTRAMUSCULAR | Status: DC | PRN
  Administered 2015-08-17: 0.5 mg via INTRAVENOUS
  Filled 2015-08-17: qty 1

## 2015-08-17 MED ORDER — SODIUM CHLORIDE 0.9 % IV SOLN
INTRAVENOUS | Status: DC
  Administered 2015-08-17 – 2015-08-19 (×5): via INTRAVENOUS

## 2015-08-17 MED ORDER — IOPAMIDOL (ISOVUE-300) INJECTION 61%
INTRAVENOUS | Status: AC
  Administered 2015-08-17: 100 mL
  Filled 2015-08-17: qty 100

## 2015-08-17 MED ORDER — ONDANSETRON 4 MG PO TBDP: 4.0000 mg | ORAL_TABLET | Freq: Four times a day (QID) | ORAL | Status: DC | PRN

## 2015-08-17 MED ORDER — ONDANSETRON HCL 4 MG/2ML IJ SOLN: 4.0000 mg | Freq: Four times a day (QID) | INTRAMUSCULAR | Status: DC | PRN

## 2015-08-17 MED ORDER — METHYLPREDNISOLONE SODIUM SUCC 125 MG IJ SOLR
125.0000 mg | Freq: Once | INTRAMUSCULAR | Status: AC
  Administered 2015-08-17: 125 mg via INTRAVENOUS
  Filled 2015-08-17: qty 2

## 2015-08-17 MED ORDER — SODIUM CHLORIDE 0.9 % IV BOLUS (SEPSIS)
500.0000 mL | Freq: Once | INTRAVENOUS | Status: AC
  Administered 2015-08-17: 500 mL via INTRAVENOUS

## 2015-08-17 MED ORDER — HYDROMORPHONE HCL 1 MG/ML IJ SOLN
1.0000 mg | Freq: Once | INTRAMUSCULAR | Status: AC
  Administered 2015-08-17: 1 mg via INTRAVENOUS
  Filled 2015-08-17: qty 1

## 2015-08-17 MED ORDER — PIPERACILLIN-TAZOBACTAM 3.375 G IVPB
3.3750 g | Freq: Three times a day (TID) | INTRAVENOUS | Status: DC
  Administered 2015-08-18 – 2015-08-20 (×7): 3.375 g via INTRAVENOUS
  Filled 2015-08-17 (×10): qty 50

## 2015-08-17 MED ORDER — FENTANYL CITRATE (PF) 100 MCG/2ML IJ SOLN
25.0000 ug | INTRAMUSCULAR | Status: DC | PRN
  Administered 2015-08-17 – 2015-08-18 (×2): 50 ug via INTRAVENOUS
  Filled 2015-08-17: qty 2

## 2015-08-17 MED ORDER — PIPERACILLIN-TAZOBACTAM 3.375 G IVPB 30 MIN
3.3750 g | Freq: Once | INTRAVENOUS | Status: AC
  Administered 2015-08-17: 3.375 g via INTRAVENOUS
  Filled 2015-08-17: qty 50

## 2015-08-17 MED ORDER — OXYCODONE-ACETAMINOPHEN 5-325 MG PO TABS
1.0000 | ORAL_TABLET | ORAL | Status: DC | PRN
  Administered 2015-08-19 – 2015-08-20 (×5): 2 via ORAL
  Filled 2015-08-17 (×5): qty 2

## 2015-08-17 MED ORDER — DIPHENHYDRAMINE HCL 50 MG/ML IJ SOLN
25.0000 mg | Freq: Once | INTRAMUSCULAR | Status: AC
  Administered 2015-08-17: 25 mg via INTRAVENOUS
  Filled 2015-08-17: qty 1

## 2015-08-17 MED ORDER — HYDROMORPHONE HCL 1 MG/ML IJ SOLN
0.5000 mg | INTRAMUSCULAR | Status: DC | PRN
  Administered 2015-08-18 (×3): 1 mg via INTRAVENOUS
  Filled 2015-08-17 (×4): qty 1

## 2015-08-17 MED ORDER — FAMOTIDINE 20 MG PO TABS
20.0000 mg | ORAL_TABLET | Freq: Every day | ORAL | Status: DC
  Administered 2015-08-18 – 2015-08-19 (×2): 20 mg via ORAL
  Filled 2015-08-17 (×2): qty 1

## 2015-08-17 MED ORDER — HYDROMORPHONE HCL 1 MG/ML IJ SOLN
0.5000 mg | Freq: Once | INTRAMUSCULAR | Status: AC
  Administered 2015-08-17: 0.5 mg via INTRAVENOUS
  Filled 2015-08-17: qty 1

## 2015-08-17 NOTE — H&P (Signed)
Adrian Vincent is an 65 y.o. male.   Chief Complaint: RUQ abdominal pain HPI: Dorn underwent exploratory laparoscopy yesterday by Dr. Nedra Hai for planned laparoscopic cholecystectomy. At the time of surgery, he was found to have significant adhesion from his stomach to the gallbladder with question of possible fistula there versus a tumor. Plan at that time, according to his family, was to do further imaging studies prior to attempting cholecystectomy. Earlier today, he developed severe right-sided abdominal pain, mostly in his right upper quadrant. It was exacerbated by movement and he came to the emergency department for further evaluation. CT scan was done which showed the expected pneumoperitoneum plus thickened gallbladder wall consistent with possible cholecystitis. He has leukocytosis of 21,300 but is afebrile. His abdominal pain has currently resolved. He is having some intermittent right shoulder pain. I was asked to see him for further evaluation.  Past Medical History:  Diagnosis Date  . Dental bridge present    upper  . GERD (gastroesophageal reflux disease)   . Hypercholesteremia   . RBBB   . Symptomatic cholelithiasis 08/2015  . Wears partial dentures    upper and lower    Past Surgical History:  Procedure Laterality Date  . LAPAROSCOPY N/A 08/16/2015   Procedure: DIAGNOSTIC LAPAROSCOPY;  Surgeon: Coralie Keens, MD;  Location: [facility];  Service: General;  Laterality: N/A;  . THORACIC DISC SURGERY     exc. bone spur    No family history on file. Social History:  reports that he has been smoking Cigarettes.  He has a 20.00 pack-year smoking history. He has never used smokeless tobacco. He reports that he drinks alcohol. He reports that he does not use drugs.  Allergies:  Allergies  Allergen Reactions  . Iodine Hives     (Not in a hospital admission)  Results for orders placed or performed during the hospital encounter of 08/17/15 (from the past  48 hour(s))  Lipase, blood     Status: None   Collection Time: 08/17/15  4:26 PM  Result Value Ref Range   Lipase 13 11 - 51 U/L  Comprehensive metabolic panel     Status: Abnormal   Collection Time: 08/17/15  4:26 PM  Result Value Ref Range   Sodium 136 135 - 145 mmol/L   Potassium 4.0 3.5 - 5.1 mmol/L   Chloride 103 101 - 111 mmol/L   CO2 25 22 - 32 mmol/L   Glucose, Bld 117 (H) 65 - 99 mg/dL   BUN 15 6 - 20 mg/dL   Creatinine, Ser 1.08 0.61 - 1.24 mg/dL   Calcium 9.1 8.9 - 10.3 mg/dL   Total Protein 6.7 6.5 - 8.1 g/dL   Albumin 3.8 3.5 - 5.0 g/dL   AST 21 15 - 41 U/L   ALT 17 17 - 63 U/L   Alkaline Phosphatase 84 38 - 126 U/L   Total Bilirubin 0.4 0.3 - 1.2 mg/dL   GFR calc non Af Amer >60 >60 mL/min   GFR calc Af Amer >60 >60 mL/min    Comment: (NOTE) The eGFR has been calculated using the CKD EPI equation. This calculation has not been validated in all clinical situations. eGFR's persistently <60 mL/min signify possible Chronic Kidney Disease.    Anion gap 8 5 - 15  CBC     Status: Abnormal   Collection Time: 08/17/15  4:26 PM  Result Value Ref Range   WBC 21.3 (H) 4.0 - 10.5 K/uL   RBC 5.26 4.22 -  5.81 MIL/uL   Hemoglobin 16.3 13.0 - 17.0 g/dL   HCT 48.9 39.0 - 52.0 %   MCV 93.0 78.0 - 100.0 fL   MCH 31.0 26.0 - 34.0 pg   MCHC 33.3 30.0 - 36.0 g/dL   RDW 13.8 11.5 - 15.5 %   Platelets 206 150 - 400 K/uL  I-stat troponin, ED     Status: None   Collection Time: 08/17/15  5:08 PM  Result Value Ref Range   Troponin i, poc 0.03 0.00 - 0.08 ng/mL   Comment 3            Comment: Due to the release kinetics of cTnI, a negative result within the first hours of the onset of symptoms does not rule out myocardial infarction with certainty. If myocardial infarction is still suspected, repeat the test at appropriate intervals.   Urinalysis, Routine w reflex microscopic     Status: Abnormal   Collection Time: 08/17/15  5:20 PM  Result Value Ref Range   Color, Urine  YELLOW YELLOW   APPearance CLEAR CLEAR   Specific Gravity, Urine 1.026 1.005 - 1.030   pH 5.0 5.0 - 8.0   Glucose, UA 100 (A) NEGATIVE mg/dL   Hgb urine dipstick NEGATIVE NEGATIVE   Bilirubin Urine NEGATIVE NEGATIVE   Ketones, ur NEGATIVE NEGATIVE mg/dL   Protein, ur NEGATIVE NEGATIVE mg/dL   Nitrite NEGATIVE NEGATIVE   Leukocytes, UA NEGATIVE NEGATIVE    Comment: MICROSCOPIC NOT DONE ON URINES WITH NEGATIVE PROTEIN, BLOOD, LEUKOCYTES, NITRITE, OR GLUCOSE <1000 mg/dL.   Ct Abdomen Pelvis W Contrast  Result Date: 08/17/2015 CLINICAL DATA:  Postoperative right upper quadrant abdominal pain. EXAM: CT ABDOMEN AND PELVIS WITH CONTRAST TECHNIQUE: Multidetector CT imaging of the abdomen and pelvis was performed using the standard protocol following bolus administration of intravenous contrast. CONTRAST:  131m ISOVUE-300 IOPAMIDOL (ISOVUE-300) INJECTION 61% COMPARISON:  None. 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 Mild bilateral posterior basilar subsegmental atelectasis is noted. Cholelithiasis is noted with gallbladder wall thickening concerning for cholecystitis. Fatty infiltration of the liver is noted. The spleen and pancreas are unremarkable. Adrenal glands appear normal. Bilateral renal cysts are noted. No hydronephrosis or renal obstruction is noted. Atherosclerosis of abdominal aorta is noted without aneurysm formation. Mild pneumoperitoneum is noted consistent with recent postoperative status. There is no evidence of bowel obstruction. The appendix appears normal. Fluid is noted in the gallbladder fossa concerning for inflammation or postoperative status. Urinary bladder appears normal. No significant adenopathy is noted. IMPRESSION: Fatty infiltration of the liver. Aortic atherosclerosis. Coronary artery calcifications are noted suggesting coronary artery disease. Cholelithiasis is noted with gallbladder wall thickening concerning for cholecystitis.  Pneumoperitoneum is noted consistent with recent postoperative status. Fluid is noted inferior to gallbladder which may be related to inflammation or postoperative status is well. Electronically Signed   By: JMarijo Conception M.D.   On: 08/17/2015 19:28   Dg Chest Portable 1 View  Result Date: 08/17/2015 CLINICAL DATA:  Right-sided chest pain for 2 hours, history of cholecystectomy yesterday EXAM: PORTABLE CHEST 1 VIEW COMPARISON:  12/11/2014 FINDINGS: Cardiac shadow is stable. Mild bibasilar atelectasis is noted left slightly greater than right. No pneumothorax is seen. No other focal abnormality is noted. IMPRESSION: Mild bibasilar atelectasis as described. Electronically Signed   By: MInez CatalinaM.D.   On: 08/17/2015 17:29    Review of Systems  Constitutional: Negative for chills and fever.  Eyes: Negative for blurred vision.  Respiratory: Negative  for cough.   Cardiovascular: Negative for chest pain.  Gastrointestinal: Positive for abdominal pain. Negative for nausea and vomiting.  Genitourinary: Negative.   Musculoskeletal: Negative.   Skin: Negative.   Neurological: Negative.   Endo/Heme/Allergies: Negative.   Psychiatric/Behavioral: Negative.     Blood pressure 135/83, pulse 98, temperature 98.3 F (36.8 C), temperature source Oral, resp. rate (!) 31, height '6\' 3"'$  (1.905 m), weight 95.3 kg (210 lb), SpO2 92 %. Physical Exam  Constitutional: He is oriented to person, place, and time. He appears well-developed and well-nourished. No distress.  HENT:  Head: Normocephalic.  Right Ear: External ear normal.  Left Ear: External ear normal.  Mouth/Throat: Oropharynx is clear and moist.  Eyes: Pupils are equal, round, and reactive to light.  Neck: Neck supple. No thyromegaly present.  Cardiovascular: Normal rate, regular rhythm and normal heart sounds.   Respiratory: Effort normal and breath sounds normal. No respiratory distress. He has no wheezes.  GI: Soft. He exhibits no  distension. There is tenderness. There is no rebound and no guarding.  Incisions are okay, mild tenderness right upper quadrant without guarding, no generalized tenderness, no peritonitis, bowel sounds are present  Musculoskeletal: Normal range of motion.  Neurological: He is alert and oriented to person, place, and time.  Skin: Skin is warm.  Psychiatric: He has a normal mood and affect.     Assessment/Plan Cholelithiasis with possible cholecystitis Status post exploratory laparoscopy  Admit, IV fluids, IV antibiotics, pain medication. Dr. Ninfa Linden to further evaluate in the a.m. I discussed the plan with him and his family.  Zenovia Jarred, MD 08/17/2015, 8:53 PM

## 2015-08-17 NOTE — ED Notes (Signed)
Pt expressing concerns for admission. Would like to go home is surgery isn't an option. Dr. Janee Mornhompson at bedside

## 2015-08-17 NOTE — ED Provider Notes (Signed)
[facility]-EMERGENCY DEPT Provider Note   CSN: [id number] Arrival date & time: 08/17/15  1603  First Provider Contact:  None       History   Chief Complaint Chief Complaint  Patient presents with  . Abdominal Pain    HPI Adrian Vincent is a 65 y.o. male.  The history is provided by the patient and the spouse. No language interpreter was used.  Abdominal Pain   This is a recurrent problem. The current episode started 3 to 5 hours ago. The problem occurs constantly. The problem has been gradually improving. Associated with: at rest when started. The pain is located in the RUQ. The quality of the pain is sharp. The pain is at a severity of 6/10. Pertinent negatives include anorexia, fever, diarrhea, hematochezia, melena, nausea, vomiting and dysuria. The symptoms are aggravated by certain positions (movement and palpation). Relieved by: narcotics and rest. Past workup includes CT scan, ultrasound and surgery. Past workup does not include GI consult. His past medical history is significant for gallstones and GERD. His past medical history does not include PUD.    Past Medical History:  Diagnosis Date  . Dental bridge present    upper  . GERD (gastroesophageal reflux disease)   . Hypercholesteremia   . RBBB   . Symptomatic cholelithiasis 08/2015  . Wears partial dentures    upper and lower    There are no active problems to display for this patient.   Past Surgical History:  Procedure Laterality Date  . LAPAROSCOPY N/A 08/16/2015   Procedure: DIAGNOSTIC LAPAROSCOPY;  Surgeon: Abigail Miyamotoouglas [name], MD;  Location: [facility];  Service: General;  Laterality: N/A;  . THORACIC DISC SURGERY     exc. bone spur       Home Medications    Prior to Admission medications   Medication Sig Start Date End Date Taking? Authorizing Provider  aspirin 325 MG tablet Take 325 mg by mouth daily.    Historical Provider, MD  atorvastatin (LIPITOR) 40 MG tablet Take 40 mg by mouth daily.     Historical Provider, MD  ciprofloxacin (CIPRO) 500 MG tablet Take 1 tablet (500 mg total) by mouth 2 (two) times daily. 08/16/15   Abigail Miyamotoouglas [name], MD  oxyCODONE-acetaminophen (ROXICET) 5-325 MG tablet Take 1-2 tablets by mouth every 4 (four) hours as needed for severe pain. 08/16/15   Abigail Miyamotoouglas [name], MD  RABEprazole (ACIPHEX) 20 MG tablet Take 40 mg by mouth daily.     Historical Provider, MD    Family History No family history on file.  Social History Social History  Substance Use Topics  . Smoking status: Current Every Day Smoker    Packs/day: 0.50    Years: 40.00    Types: Cigarettes  . Smokeless tobacco: Never Used  . Alcohol use Yes     Comment: moderately     Allergies   Iodine   Review of Systems Review of Systems  Constitutional: Negative for chills, diaphoresis and fever.  HENT: Negative.   Eyes: Negative.   Respiratory: Negative for cough and shortness of breath.   Cardiovascular: Negative for chest pain.  Gastrointestinal: Positive for abdominal pain. Negative for anorexia, diarrhea, hematochezia, melena, nausea and vomiting.  Genitourinary: Negative.  Negative for dysuria.  Musculoskeletal:       R shoulder/scapula/neck pain  Skin: Negative for pallor.  Neurological: Negative.   Psychiatric/Behavioral: Negative.      Physical Exam Updated Vital Signs BP 140/81 (BP Location: Right Arm)   Pulse 88  Temp 98.3 F (36.8 C) (Oral)   Resp 17   Ht 6\' 3"  (1.905 m)   Wt 95.3 kg   SpO2 94%   BMI 26.25 kg/m   Physical Exam  Constitutional: He is oriented to person, place, and time. He appears well-developed and well-nourished. He appears distressed (mod).  HENT:  Head: Normocephalic and atraumatic.  MMM  Eyes: Conjunctivae are normal. No scleral icterus.  Neck: Normal range of motion. Neck supple. No tracheal deviation present.  Cardiovascular: Normal rate, regular rhythm, normal heart sounds and intact distal pulses.   No murmur  heard. Pulmonary/Chest: Effort normal and breath sounds normal. No stridor. No respiratory distress. He has no wheezes. He has no rales. He exhibits no tenderness.  Abdominal: Soft. He exhibits distension. There is tenderness (mild in RUQ). There is no rebound and no guarding.  Musculoskeletal: He exhibits no deformity.  Neurological: He is alert and oriented to person, place, and time. He is not disoriented. GCS eye subscore is 4. GCS verbal subscore is 5. GCS motor subscore is 6.  Skin: Skin is warm and dry. Capillary refill takes less than 2 seconds. He is not diaphoretic.  Psychiatric: He has a normal mood and affect. His behavior is normal. Judgment and thought content normal.  Nursing note and vitals reviewed.    ED Treatments / Results  Labs (all labs ordered are listed, but only abnormal results are displayed) Labs Reviewed  CBC - Abnormal; Notable for the following:       Result Value   WBC 21.3 (*)    All other components within normal limits  LIPASE, BLOOD  COMPREHENSIVE METABOLIC PANEL  URINALYSIS, ROUTINE W REFLEX MICROSCOPIC (NOT AT [facility])    EKG  EKG Interpretation  Date/Time:  Thursday August 17 2015 17:03:17 EDT Ventricular Rate:  89 PR Interval:    QRS Duration: 126 QT Interval:  351 QTC Calculation: 427 R Axis:   78 Text Interpretation:  Sinus rhythm Right bundle branch block Borderline ST elevation, lateral leads Baseline wander in lead(s) V6 No significant change since last tracing Confirmed by Ethelda Chick  MD, [facility] [phone]) on 08/18/2015 5:47:50 PM       Radiology Ct Abdomen Pelvis W Contrast  Result Date: 08/17/2015 CLINICAL DATA:  Postoperative right upper quadrant abdominal pain. EXAM: CT ABDOMEN AND PELVIS WITH CONTRAST TECHNIQUE: Multidetector CT imaging of the abdomen and pelvis was performed using the standard protocol following bolus administration of intravenous contrast. CONTRAST:  ISOVUE-300 IOPAMIDOL (ISOVUE-300) INJECTION 61% COMPARISON:   None. 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 Mild bilateral posterior basilar subsegmental atelectasis is noted. Cholelithiasis is noted with gallbladder wall thickening concerning for cholecystitis. Fatty infiltration of the liver is noted. The spleen and pancreas are unremarkable. Adrenal glands appear normal. Bilateral renal cysts are noted. No hydronephrosis or renal obstruction is noted. Atherosclerosis of abdominal aorta is noted without aneurysm formation. Mild pneumoperitoneum is noted consistent with recent postoperative status. There is no evidence of bowel obstruction. The appendix appears normal. Fluid is noted in the gallbladder fossa concerning for inflammation or postoperative status. Urinary bladder appears normal. No significant adenopathy is noted. IMPRESSION: Fatty infiltration of the liver. Aortic atherosclerosis. Coronary artery calcifications are noted suggesting coronary artery disease. Cholelithiasis is noted with gallbladder wall thickening concerning for cholecystitis. Pneumoperitoneum is noted consistent with recent postoperative status. Fluid is noted inferior to gallbladder which may be related to inflammation or postoperative status is well. Electronically Signed   By: Fayrene Fearing  Christen Butter, M.D.   On: 08/17/2015 19:28   Dg Chest Portable 1 View  Result Date: 08/17/2015 CLINICAL DATA:  Right-sided chest pain for 2 hours, history of cholecystectomy yesterday EXAM: PORTABLE CHEST 1 VIEW COMPARISON:  12/11/2014 FINDINGS: Cardiac shadow is stable. Mild bibasilar atelectasis is noted left slightly greater than right. No pneumothorax is seen. No other focal abnormality is noted. IMPRESSION: Mild bibasilar atelectasis as described. Electronically Signed   By: Alcide Clever M.D.   On: 08/17/2015 17:29    Procedures Procedures (including critical care time)  Medications Ordered in ED Medications  HYDROmorphone (DILAUDID) injection 0.5 mg (0.5 mg  Intravenous Given 08/17/15 1658)  diphenhydrAMINE (BENADRYL) injection 25 mg (25 mg Intravenous Given 08/17/15 1753)  methylPREDNISolone sodium succinate (SOLU-MEDROL) 125 mg/2 mL injection 125 mg (125 mg Intravenous Given 08/17/15 1753)  iopamidol (ISOVUE-300) 61 % injection (100 mLs  Contrast Given 08/17/15 1905)  piperacillin-tazobactam (ZOSYN) IVPB 3.375 g (0 g Intravenous Stopped 08/17/15 2112)  sodium chloride 0.9 % bolus 500 mL (0 mLs Intravenous Stopped 08/17/15 2229)  HYDROmorphone (DILAUDID) injection 1 mg (1 mg Intravenous Given 08/17/15 2034)     Initial Impression / Assessment and Plan / ED Course  I have reviewed the triage vital signs and the nursing notes.  Pertinent labs & imaging results that were available during my care of the patient were reviewed by me and considered in my medical decision making (see chart for details).  Clinical Course   65 year old male with no significant past medical history who presents 1 day after attempted lap chole because of sudden onset right upper quadrant pain.  Operation had to be aborted due to significant adhesions  the gallbladder.  Patient had sudden onset of pain concerning for possible perforation or cholecystitis.  Unfortunately due to risks recent manipulation many findings were to be confounded.  Unable to evaluate for free air due to recent insufflation.  There would also limit right upper quadrant ultrasound.  As result patient sent to CT with contrast after premedication due to history of rash per radiology protocol.  Blood work was grossly unremarkable sets from a white count of 20,000 and a lactic acidosis of 2.4 for which he was given 500cc of NaCl. Patient did say he had pain in his neck as well, so EKG and trop were added, but both were negative for cardiac ischemia. Although many of these changes and chief complaint are likely related to postop course, general surgery was consulted for further evaluation due to concern for cholecystitis  based off of leukocytosis and CT findings.  Patient was started on Zosyn in the ED.  General surgery recommended admission for the patient.  They discussed plan and findings with patient.  Patient was otherwise stable in the ED.  Vital signs were stable.  Patient was redosed with 1 mg Dilaudid after initial 0.5mg .  Final Clinical Impressions(s) / ED Diagnoses   Final diagnoses:  Right upper quadrant abdominal pain  Cholecystitis  Lactic acidosis  Leukocytosis    New Prescriptions New Prescriptions   No medications on file     Maretta Bees, MD 08/19/15 1210    Arby Barrette, MD 08/30/15 [id number]

## 2015-08-17 NOTE — ED Triage Notes (Signed)
Pt had a out patient gallbladder surgery yesterday by dr.blackman and was unable to removed gallbladder due to "gall bladder being stuck to stomach" per pt. Pt states while sitting at home this afternoon he started having sharp pain in his RUQ and around incisions. Ems gave 250 mcg of fentanyl. Pain states pain is 6/10.

## 2015-08-17 NOTE — ED Notes (Signed)
CT states they have a protocol to wait 4 hrs after being medicated for a allergy. MD and charge rn made aware. MD states they will call CT to clarify.

## 2015-08-17 NOTE — ED Notes (Signed)
Family at bedside. 

## 2015-08-17 NOTE — ED Notes (Addendum)
CT states pt can have scan at this time if an RN comes with pt. Ct states they will call when pt can come over. Family updated.

## 2015-08-17 NOTE — ED Notes (Signed)
Pt stood to use the urinal; c/o severe pain in two incisions in abdomen. Pt diaphoretic and restless. Fentanyl given per orders

## 2015-08-17 NOTE — Op Note (Signed)
NAME:  KYLIE, GROS                     ACCOUNT NO.:  MEDICAL RECORD NO.:  [id number]  LOCATION:                                 FACILITY:  PHYSICIAN:  Abigail Miyamoto, M.D. DATE OF BIRTH:  1950-07-13  DATE OF PROCEDURE:  08/16/2015 DATE OF DISCHARGE:                              OPERATIVE REPORT   PREOPERATIVE DIAGNOSIS:  Symptomatic cholelithiasis.  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  PROCEDURE:  Diagnostic laparoscopy.  SURGEON:  Abigail Miyamoto, M.D.  ANESTHESIA:  General endotracheal anesthesia and 0.5% Marcaine.  ESTIMATED BLOOD LOSS:  Minimal.  INDICATIONS:  A 65 year old gentleman, who presented with right upper quadrant abdominal pain.  He has seen his gastroenterologist and had an ultrasound performed showing a contracted gallbladder with stones. Given the findings and symptoms, decision was made to proceed to the operating room for cholecystectomy.  FINDINGS:  The patient was found to have a grossly distended stomach. There was a knuckle stomach stuck to a completely contracted firm rock- hard gallbladder.  This finding was worrisome for potential fistula or malignancy.  Decision was therefore made to terminate the procedure for further workup.  PROCEDURE IN DETAIL:  The patient was brought to the operating room, identified as Concha Se.  He was placed supine on the operating room table and general anesthesia was induced.  His abdomen was then prepped and draped in usual sterile fashion.  I made a small vertical incision above the umbilicus.  I carried this down to the fascia, which was then opened with a scalpel.  A hemostat was used to pass through the peritoneal cavity under direct vision.  A 0 Vicryl pursestring suture was then placed around the fascial opening.  The stump was placed through the opening and insufflation of the abdomen was begun.  A 5-mm port was then placed in the patient's epigastrium,  two more in the right upper quadrant all under direct vision.  The stomach was found to be grossly distended.  An orogastric tube was placed, this deflated the stomach.  The gallbladder was contracted and appeared to be chronically inflamed.  I was able to elevate the gallbladder and take the omentum off it.  The stomach however was firmly adherent to the gallbladder.  I was able to circumferentially get around this area stuck to the gallbladder but it would not come down.  It had the appearance that this could be a fistula or even a malignancy.  I was also concerned about a possible gastric outlet obstruction.  At this point, decision was made to terminate the procedure.  Hemostasis appeared to be achieved.  The gallbladder was thoroughly examined and I saw no evidence of injury to this or to the stomach.  At this point, I irrigated the abdomen with normal saline.  All ports were removed under direct vision and the abdomen was desufflated.  I tied off with 0 Vicryl to the umbilicus closing the fascial defect. All incisions were anesthetized with Marcaine and closed with 4-0 Monocryl subcuticular sutures.  Skin glue was then applied.  The patient tolerated the procedure well.  All counts were correct at the end of  the procedure.  The patient was then extubated in the operating room and taken in stable condition to recovery room.     Abigail Miyamotoouglas Dezmon Conover, M.D.     DB/MEDQ  D:  08/16/2015  T:  08/16/2015  Job:  [id number]

## 2015-08-17 NOTE — ED Notes (Signed)
Attempted report 

## 2015-08-18 ENCOUNTER — Inpatient Hospital Stay (HOSPITAL_COMMUNITY): Payer: BLUE CROSS/BLUE SHIELD | Admitting: Certified Registered Nurse Anesthetist

## 2015-08-18 ENCOUNTER — Encounter (HOSPITAL_COMMUNITY)
Admission: EM | Disposition: A | Discharge status: Home / Self Care | Payer: Self-pay | Source: Home / Self Care | Attending: Surgery

## 2015-08-18 ENCOUNTER — Encounter (HOSPITAL_COMMUNITY): Payer: Self-pay | Admitting: *Deleted

## 2015-08-18 HISTORY — PX: CHOLECYSTECTOMY: SHX55

## 2015-08-18 LAB — CBC
HCT: 47.5 % (ref 39.0–52.0)
Hemoglobin: 15.5 g/dL (ref 13.0–17.0)
MCH: 30.2 pg (ref 26.0–34.0)
MCHC: 32.6 g/dL (ref 30.0–36.0)
MCV: 92.6 fL (ref 78.0–100.0)
PLATELETS: 210 10*3/uL (ref 150–400)
RBC: 5.13 MIL/uL (ref 4.22–5.81)
RDW: 14.2 % (ref 11.5–15.5)
WBC: 22.8 10*3/uL — AB (ref 4.0–10.5)

## 2015-08-18 LAB — BASIC METABOLIC PANEL
Anion gap: 11 (ref 5–15)
BUN: 15 mg/dL (ref 6–20)
CALCIUM: 8.7 mg/dL — AB (ref 8.9–10.3)
CO2: 22 mmol/L (ref 22–32)
CREATININE: 1.07 mg/dL (ref 0.61–1.24)
Chloride: 103 mmol/L (ref 101–111)
GFR calc Af Amer: >60 mL/min (ref >60)
GLUCOSE: 144 mg/dL — AB (ref 65–99)
Potassium: 4.1 mmol/L (ref 3.5–5.1)
Sodium: 136 mmol/L (ref 135–145)

## 2015-08-18 LAB — SURGICAL PCR SCREEN
MRSA, PCR: NEGATIVE
Staphylococcus aureus: NEGATIVE

## 2015-08-18 SURGERY — LAPAROSCOPIC CHOLECYSTECTOMY
Anesthesia: General | Site: Abdomen

## 2015-08-18 MED ORDER — FENTANYL CITRATE (PF) 250 MCG/5ML IJ SOLN
INTRAMUSCULAR | Status: AC
  Filled 2015-08-18: qty 5

## 2015-08-18 MED ORDER — HYDROMORPHONE HCL 1 MG/ML IJ SOLN
INTRAMUSCULAR | Status: AC
  Administered 2015-08-18: 0.5 mg via INTRAVENOUS
  Filled 2015-08-18: qty 1

## 2015-08-18 MED ORDER — MIDAZOLAM HCL 2 MG/2ML IJ SOLN
INTRAMUSCULAR | Status: AC
  Filled 2015-08-18: qty 2

## 2015-08-18 MED ORDER — HYDROMORPHONE HCL 1 MG/ML IJ SOLN
0.2500 mg | INTRAMUSCULAR | Status: DC | PRN
  Administered 2015-08-18 (×2): 0.5 mg via INTRAVENOUS

## 2015-08-18 MED ORDER — DEXAMETHASONE SODIUM PHOSPHATE 10 MG/ML IJ SOLN
INTRAMUSCULAR | Status: AC
  Filled 2015-08-18: qty 1

## 2015-08-18 MED ORDER — IOPAMIDOL (ISOVUE-300) INJECTION 61%
INTRAVENOUS | Status: AC
  Filled 2015-08-18: qty 50

## 2015-08-18 MED ORDER — CEFAZOLIN SODIUM-DEXTROSE 2-3 GM-% IV SOLR
INTRAVENOUS | Status: DC | PRN
  Administered 2015-08-18: 2 g via INTRAVENOUS

## 2015-08-18 MED ORDER — CEFAZOLIN SODIUM 1 G IJ SOLR
INTRAMUSCULAR | Status: AC
  Filled 2015-08-18: qty 20

## 2015-08-18 MED ORDER — LIDOCAINE HCL (CARDIAC) 20 MG/ML IV SOLN
INTRAVENOUS | Status: DC | PRN
  Administered 2015-08-18: 100 mg via INTRAVENOUS

## 2015-08-18 MED ORDER — ONDANSETRON HCL 4 MG/2ML IJ SOLN
INTRAMUSCULAR | Status: DC | PRN
  Administered 2015-08-18: 4 mg via INTRAVENOUS

## 2015-08-18 MED ORDER — HYDROMORPHONE HCL 1 MG/ML IJ SOLN
0.5000 mg | INTRAMUSCULAR | Status: DC | PRN
  Administered 2015-08-18 – 2015-08-19 (×3): 1 mg via INTRAVENOUS
  Filled 2015-08-18 (×3): qty 1

## 2015-08-18 MED ORDER — PROMETHAZINE HCL 25 MG/ML IJ SOLN: 6.2500 mg | INTRAMUSCULAR | Status: DC | PRN

## 2015-08-18 MED ORDER — ROCURONIUM BROMIDE 100 MG/10ML IV SOLN
INTRAVENOUS | Status: DC | PRN
  Administered 2015-08-18: 50 mg via INTRAVENOUS
  Administered 2015-08-18: 30 mg via INTRAVENOUS

## 2015-08-18 MED ORDER — MEPERIDINE HCL 25 MG/ML IJ SOLN: 6.2500 mg | INTRAMUSCULAR | Status: DC | PRN

## 2015-08-18 MED ORDER — PROPOFOL 10 MG/ML IV BOLUS
INTRAVENOUS | Status: DC | PRN
  Administered 2015-08-18: 160 mg via INTRAVENOUS

## 2015-08-18 MED ORDER — BUPIVACAINE-EPINEPHRINE 0.25% -1:200000 IJ SOLN
INTRAMUSCULAR | Status: DC | PRN
  Administered 2015-08-18: 20 mL

## 2015-08-18 MED ORDER — FENTANYL CITRATE (PF) 250 MCG/5ML IJ SOLN
INTRAMUSCULAR | Status: DC | PRN
  Administered 2015-08-18: 150 ug via INTRAVENOUS
  Administered 2015-08-18: 50 ug via INTRAVENOUS
  Administered 2015-08-18: 100 ug via INTRAVENOUS
  Administered 2015-08-18: 50 ug via INTRAVENOUS
  Administered 2015-08-18: 100 ug via INTRAVENOUS

## 2015-08-18 MED ORDER — SUGAMMADEX SODIUM 500 MG/5ML IV SOLN
INTRAVENOUS | Status: AC
  Filled 2015-08-18: qty 5

## 2015-08-18 MED ORDER — LACTATED RINGERS IV SOLN
INTRAVENOUS | Status: DC
  Administered 2015-08-18 (×3): via INTRAVENOUS

## 2015-08-18 MED ORDER — LIDOCAINE 2% (20 MG/ML) 5 ML SYRINGE
INTRAMUSCULAR | Status: AC
Start: 2015-08-18 — End: 2015-08-18
  Filled 2015-08-18: qty 5

## 2015-08-18 MED ORDER — DEXAMETHASONE SODIUM PHOSPHATE 4 MG/ML IJ SOLN
INTRAMUSCULAR | Status: DC | PRN
  Administered 2015-08-18: 10 mg via INTRAVENOUS

## 2015-08-18 MED ORDER — PROPOFOL 10 MG/ML IV BOLUS
INTRAVENOUS | Status: AC
  Filled 2015-08-18: qty 20

## 2015-08-18 MED ORDER — BUPIVACAINE-EPINEPHRINE (PF) 0.25% -1:200000 IJ SOLN
INTRAMUSCULAR | Status: AC
  Filled 2015-08-18: qty 30

## 2015-08-18 MED ORDER — SUGAMMADEX SODIUM 200 MG/2ML IV SOLN
INTRAVENOUS | Status: DC | PRN
  Administered 2015-08-18: 500 mg via INTRAVENOUS

## 2015-08-18 MED ORDER — SODIUM CHLORIDE 0.9 % IR SOLN
Status: DC | PRN
  Administered 2015-08-18 (×2): 1000 mL

## 2015-08-18 MED ORDER — 0.9 % SODIUM CHLORIDE (POUR BTL) OPTIME
TOPICAL | Status: DC | PRN
  Administered 2015-08-18: 1000 mL

## 2015-08-18 MED ORDER — ONDANSETRON HCL 4 MG/2ML IJ SOLN
INTRAMUSCULAR | Status: AC
  Filled 2015-08-18: qty 2

## 2015-08-18 SURGICAL SUPPLY — 42 items
APPLIER CLIP 5 13 M/L LIGAMAX5 (MISCELLANEOUS) ×2
APR CLP MED LRG 5 ANG JAW (MISCELLANEOUS) ×1
BAG SPEC RTRVL LRG 6X4 10 (ENDOMECHANICALS) ×1
CANISTER SUCTION 2500CC (MISCELLANEOUS) ×2 IMPLANT
CHLORAPREP W/TINT 26ML (MISCELLANEOUS) ×2 IMPLANT
CLIP APPLIE 5 13 M/L LIGAMAX5 (MISCELLANEOUS) ×1 IMPLANT
COVER SURGICAL LIGHT HANDLE (MISCELLANEOUS) ×2 IMPLANT
DRAIN CHANNEL 19F RND (DRAIN) ×1 IMPLANT
ELECT REM PT RETURN 9FT ADLT (ELECTROSURGICAL) ×2
ELECTRODE REM PT RTRN 9FT ADLT (ELECTROSURGICAL) ×1 IMPLANT
ENDOLOOP SUT PDS II  0 18 (SUTURE) ×3
ENDOLOOP SUT PDS II 0 18 (SUTURE) IMPLANT
EVACUATOR SILICONE 100CC (DRAIN) ×1 IMPLANT
GLOVE BIO SURGEON STRL SZ7.5 (GLOVE) ×1 IMPLANT
GLOVE BIOGEL PI IND STRL 7.5 (GLOVE) IMPLANT
GLOVE BIOGEL PI IND STRL 8 (GLOVE) IMPLANT
GLOVE BIOGEL PI INDICATOR 7.5 (GLOVE) ×1
GLOVE BIOGEL PI INDICATOR 8 (GLOVE) ×1
GLOVE ECLIPSE 7.5 STRL STRAW (GLOVE) ×1 IMPLANT
GLOVE SURG SIGNA 7.5 PF LTX (GLOVE) ×2 IMPLANT
GOWN STRL REUS W/ TWL LRG LVL3 (GOWN DISPOSABLE) ×2 IMPLANT
GOWN STRL REUS W/ TWL XL LVL3 (GOWN DISPOSABLE) ×1 IMPLANT
GOWN STRL REUS W/TWL LRG LVL3 (GOWN DISPOSABLE) ×2
GOWN STRL REUS W/TWL XL LVL3 (GOWN DISPOSABLE) ×4
KIT BASIN OR (CUSTOM PROCEDURE TRAY) ×2 IMPLANT
KIT ROOM TURNOVER OR (KITS) ×2 IMPLANT
LIQUID BAND (GAUZE/BANDAGES/DRESSINGS) ×2 IMPLANT
NS IRRIG 1000ML POUR BTL (IV SOLUTION) ×2 IMPLANT
PAD ARMBOARD 7.5X6 YLW CONV (MISCELLANEOUS) ×2 IMPLANT
POUCH SPECIMEN RETRIEVAL 10MM (ENDOMECHANICALS) ×2 IMPLANT
SCISSORS LAP 5X35 DISP (ENDOMECHANICALS) ×2 IMPLANT
SET IRRIG TUBING LAPAROSCOPIC (IRRIGATION / IRRIGATOR) ×2 IMPLANT
SLEEVE ENDOPATH XCEL 5M (ENDOMECHANICALS) ×4 IMPLANT
SPECIMEN JAR SMALL (MISCELLANEOUS) ×2 IMPLANT
SUT ETHILON 2 0 FS 18 (SUTURE) ×1 IMPLANT
SUT MON AB 4-0 PC3 18 (SUTURE) ×2 IMPLANT
TOWEL OR 17X24 6PK STRL BLUE (TOWEL DISPOSABLE) ×2 IMPLANT
TOWEL OR 17X26 10 PK STRL BLUE (TOWEL DISPOSABLE) ×2 IMPLANT
TRAY LAPAROSCOPIC MC (CUSTOM PROCEDURE TRAY) ×2 IMPLANT
TROCAR XCEL BLUNT TIP 100MML (ENDOMECHANICALS) ×2 IMPLANT
TROCAR XCEL NON-BLD 5MMX100MML (ENDOMECHANICALS) ×2 IMPLANT
TUBING INSUFFLATION (TUBING) ×2 IMPLANT

## 2015-08-18 NOTE — Op Note (Signed)
LAPAROSCOPIC CHOLECYSTECTOMY  Procedure Note  Adrian Vincent 08/17/2015 - 08/18/2015   Pre-op Diagnosis: Cholecystitis      Post-op Diagnosis: same  Procedure(s): LAPAROSCOPIC CHOLECYSTECTOMY  Surgeon(s): Abigail Miyamotoouglas Zakari Bathe, MD  Anesthesia: General  Staff:  Circulator: Woodroe ModeKelly A Hickling, RN Relief Circulator: Royann ShiversPeyton Lloyd, RN Scrub Person: Donald Poreeborah A Blackwell, CST; Rushdan Winona LegatoM Islam, RN  Estimated Blood Loss: Minimal               Specimens: sent to path          [facility] LLCBLACKMAN,Adrian Solem A   Date: 08/18/2015  Time: 3:30 PM

## 2015-08-18 NOTE — Anesthesia Procedure Notes (Signed)
Procedure Name: Intubation Date/Time: 08/18/2015 1:53 PM Performed by: Reine JustFLOWERS, Emelyn Roen T Pre-anesthesia Checklist: Patient identified, Emergency Drugs available, Suction available, Patient being monitored and Timeout performed Patient Re-evaluated:Patient Re-evaluated prior to inductionOxygen Delivery Method: Circle system utilized and Simple face mask Preoxygenation: Pre-oxygenation with 100% oxygen Intubation Type: IV induction Ventilation: Mask ventilation without difficulty Laryngoscope Size: Miller and 3 Grade View: Grade I Tube type: Oral Tube size: 7.5 mm Number of attempts: 1 Airway Equipment and Method: Patient positioned with wedge pillow Placement Confirmation: ETT inserted through vocal cords under direct vision,  positive ETCO2 and breath sounds checked- equal and bilateral Secured at: 23 cm Tube secured with: Tape Dental Injury: Teeth and Oropharynx as per pre-operative assessment

## 2015-08-18 NOTE — Transfer of Care (Signed)
Immediate Anesthesia Transfer of Care Note  Patient: Adrian Vincent  Procedure(s) Performed: Procedure(s): LAPAROSCOPIC CHOLECYSTECTOMY (N/A)  Patient Location: PACU  Anesthesia Type:General  Level of Consciousness: awake, alert  and oriented  Airway & Oxygen Therapy: Patient Spontanous Breathing and Patient connected to nasal cannula oxygen  Post-op Assessment: Report given to RN and Post -op Vital signs reviewed and stable  Post vital signs: Reviewed and stable  Last Vitals:  Vitals:   08/18/15 1250 08/18/15 1540  BP: (!) 169/81   Pulse: 99   Resp:    Temp:  (P) 37.6 C    Last Pain:  Vitals:   08/18/15 1540  TempSrc:   PainSc: (P) Asleep         Complications: No apparent anesthesia complications

## 2015-08-18 NOTE — Anesthesia Preprocedure Evaluation (Addendum)
Anesthesia Evaluation  Patient identified by MRN, date of birth, ID band Patient awake    Reviewed: Allergy & Precautions, NPO status , Patient's Chart, lab work & pertinent test results  Airway Mallampati: I  TM Distance: >3 FB Neck ROM: Full    Dental  (+) Teeth Intact, Dental Advisory Given   Pulmonary Current Smoker,    breath sounds clear to auscultation       Cardiovascular  Rhythm:Regular Rate:Normal     Neuro/Psych    GI/Hepatic GERD  Medicated and Controlled,  Endo/Other    Renal/GU      Musculoskeletal   Abdominal   Peds  Hematology   Anesthesia Other Findings   Reproductive/Obstetrics                             Anesthesia Physical  Anesthesia Plan  ASA: II  Anesthesia Plan: General   Post-op Pain Management:    Induction: Intravenous  Airway Management Planned: Oral ETT  Additional Equipment:   Intra-op Plan:   Post-operative Plan: Extubation in OR  Informed Consent: I have reviewed the patients History and Physical, chart, labs and discussed the procedure including the risks, benefits and alternatives for the proposed anesthesia with the patient or authorized representative who has indicated his/her understanding and acceptance.   Dental advisory given  Plan Discussed with: CRNA and Surgeon  Anesthesia Plan Comments:         Anesthesia Quick Evaluation

## 2015-08-18 NOTE — Op Note (Signed)
[name], Vincent                ACCOUNT NO.:  [id number]  MEDICAL RECORD NO.:  [id number]  LOCATION:  6N23C                        FACILITY:  [facility]  PHYSICIAN:  Abigail Miyamoto, M.D. DATE OF BIRTH:  01-02-1951  DATE OF PROCEDURE:  08/18/2015 DATE OF DISCHARGE:                              OPERATIVE REPORT   PREOPERATIVE DIAGNOSIS:  Chronic cholecystitis with cholelithiasis.  POSTOPERATIVE DIAGNOSIS:  Chronic cholecystitis with cholelithiasis.  PROCEDURE:  Laparoscopic cholecystectomy.  SURGEON:  Abigail Miyamoto, MD.  ANESTHESIA:  General and 0.25% Marcaine.  ESTIMATED BLOOD LOSS:  Minimal.  INDICATIONS:  This is a 65 year old gentleman with a contracted gallbladder full of stones.  Two days ago, he underwent an attempt at a laparoscopic cholecystectomy.  The gallbladder was found to be contracted and inflamed with stomach densely adherent to the gallbladder.  This was done in the outpatient center.  At the time of surgery, findings were suspicious that this could may represent a malignancy and I was worried that he would need an open laparotomy, therefore decision was made to terminate the procedure.  Plans were to proceed with a potential endoscopy as well as a CT of the abdomen. Slightly more than 24 hours after surgery, he began to develop right upper quadrant abdominal pain and presents to the hospital.  A CAT scan was performed which showed findings consistent with chronic cholecystitis and gallstones.  There was no finding on the CT scan worrisome for malignancy.  Also discussed with Gastroenterology and they said to go and proceed with cholecystectomy.  FINDINGS:  The patient was found to have the stomach again densely adherent to the gallbladder.  At this time, there was fibrinous exudate in the right upper quadrant.  I was able to get the gallbladder free from the stomach without injury to the stomach and the findings appeared consistent with chronic  cholecystitis with gallstones and stone impacted in the gallbladder neck and not malignancy.  PROCEDURE IN DETAIL:  The patient was brought to the operating room, identified as Adrian Vincent.  He was placed supine on the operating table and general anesthesia was induced.  His abdomen was then prepped and draped in usual sterile fashion.  I opened up all previous incisions with a scalpel.  I then removed the 0 Vicryl pursestring suture at the umbilicus.  I placed a new 0 Vicryl pursestring suture there and I then placed a Hasson port through the abdomen under direct vision.  The abdomen was then insufflated with carbon dioxide.  I then placed three 5 mm trocars in the epigastric and right upper quadrant under direct vision.  There was fibrinous exudate in the right upper quadrant from the previous cholecystectomy.  This appeared slightly infectious in nature.  I again was able to easily visualize the gallbladder and it was just like it presented 2 days prior.  I was able to finally grasp and elevate it and I was able to, with the laparoscopic scissors, free the stomach up off the gallbladder.  I evaluated the stomach and saw no evidence of injury.  At this point, I turned my attention toward the hilum of the gallbladder.  This gallbladder was densely inflamed at the  base with a stone impacted in the gallbladder neck.  After dissection at the hilum as well as takedown of the gallbladder slightly medial and laterally, I was able to identify the cystic duct.  The gallbladder tore right at the opening of the cystic duct and a large stone came out.  I was able to get a critical window around the cystic duct.  I then decided to go ahead and transect the cystic duct from the gallbladder leaving a little rim of gallbladder.  I then closed this with 2 endo- loops.  I then identified the cystic artery and clipped it several times proximally, distally, and transected.  The gallbladder was then  slowly dissected free from the liver bed with electrocautery.  Again, it was diffusely chronically inflamed and very thick walled and full of stones. At this point, once this was freed from the gallbladder bed, I placed it in an endosac and removed it through the incision at the umbilicus.  I then again evaluated the liver bed and hemostasis felt to be achieved. I then used the most lateral 5 mm trocar and placed a 19-French Blake drain through the epigastric port and pulled out of this trocar site.  I placed this into the gallbladder fossa and sutured in place with 2-0 nylon sutures at the skin.  I then irrigated the abdomen further with saline.  Hemostasis appeared to be achieved.  All ports were removed under direct vision and the abdomen was deflated.  The 0 Vicryl was tied in place at the umbilicus closing the fascial defect.  The remaining incisions were then closed with 4-0 Monocryl sutures and skin glue after anesthetizing with Marcaine.  The patient tolerated the procedure well. All sponge, needle, and instrument counts were correct at the end of procedure.  The patient was then extubated in the operating room and taken in stable condition to recovery room.     Abigail Miyamotoouglas Kynsli Haapala, M.D.     DB/MEDQ  D:  08/18/2015  T:  08/18/2015  Job:  [id number]

## 2015-08-18 NOTE — Anesthesia Postprocedure Evaluation (Signed)
Anesthesia Post Note  Patient: Adrian Vincent  Procedure(s) Performed: Procedure(s) (LRB): LAPAROSCOPIC CHOLECYSTECTOMY (N/A)  Patient location during evaluation: PACU Anesthesia Type: General Level of consciousness: sedated and patient cooperative Pain management: pain level controlled Vital Signs Assessment: post-procedure vital signs reviewed and stable Respiratory status: spontaneous breathing Cardiovascular status: stable Anesthetic complications: no    Last Vitals:  Vitals:   08/18/15 1610 08/18/15 1640  BP: (!) 158/95   Pulse: 94   Resp: 16   Temp:  37.2 C    Last Pain:  Vitals:   08/18/15 1640  TempSrc:   PainSc: 4                  Lewie LoronJohn Yatziri Wainwright

## 2015-08-18 NOTE — Progress Notes (Signed)
Patient ID: Adrian Vincent, male   DOB: 07/21/1950, 65 y.o.   MRN: [id number]  Pt seen and examined. Still with moderate RUQ tenderness. I suspect the largest stone in the gallbladder shifted and may be blocking off the gallbladder causing cholecystitis or is just more inflamed from surgery. The CT scan is at least not worrisome for cancer.  I discussed the case with GI who feels endoscopy is not needed. Will plan laparoscopic, possible open cholecystectomy with possible cholangiogram today. I discussed the risks which include but are not limited to bleeding, infection, injury to the stomach or bile duct, the need to convert to an open procedure, etc.  He agrees to proceed.

## 2015-08-19 LAB — COMPREHENSIVE METABOLIC PANEL
ALT: 29 U/L (ref 17–63)
ANION GAP: 11 (ref 5–15)
AST: 32 U/L (ref 15–41)
Albumin: 3 g/dL — ABNORMAL LOW (ref 3.5–5.0)
Alkaline Phosphatase: 59 U/L (ref 38–126)
BILIRUBIN TOTAL: 0.9 mg/dL (ref 0.3–1.2)
BUN: 16 mg/dL (ref 6–20)
CO2: 22 mmol/L (ref 22–32)
Calcium: 8.4 mg/dL — ABNORMAL LOW (ref 8.9–10.3)
Chloride: 103 mmol/L (ref 101–111)
Creatinine, Ser: 0.96 mg/dL (ref 0.61–1.24)
Glucose, Bld: 108 mg/dL — ABNORMAL HIGH (ref 65–99)
POTASSIUM: 4.1 mmol/L (ref 3.5–5.1)
Sodium: 136 mmol/L (ref 135–145)
TOTAL PROTEIN: 6.1 g/dL — AB (ref 6.5–8.1)

## 2015-08-19 LAB — CBC
HEMATOCRIT: 44.1 % (ref 39.0–52.0)
Hemoglobin: 14.3 g/dL (ref 13.0–17.0)
MCH: 30.5 pg (ref 26.0–34.0)
MCHC: 32.4 g/dL (ref 30.0–36.0)
MCV: 94 fL (ref 78.0–100.0)
Platelets: 186 10*3/uL (ref 150–400)
RBC: 4.69 MIL/uL (ref 4.22–5.81)
RDW: 14.8 % (ref 11.5–15.5)
WBC: 20.9 10*3/uL — ABNORMAL HIGH (ref 4.0–10.5)

## 2015-08-19 NOTE — Progress Notes (Signed)
1 Day Post-Op  Subjective: Feels better Less pain  Objective: Vital signs in last 24 hours: Temp:  [97.8 F (36.6 C)-99.7 F (37.6 C)] 98.6 F (37 C) (08/12 0554) Pulse Rate:  [79-99] 79 (08/12 0554) Resp:  [16-20] 20 (08/12 0554) BP: (117-169)/(70-95) 147/77 (08/12 0554) SpO2:  [90 %-96 %] 96 % (08/12 0554) Last BM Date: 08/15/15  Intake/Output from previous day: 08/11 0701 - 08/12 0700 In: 3192.2 [I.V.:2992.2; IV Piggyback:200] Out: 161 [Urine:1; Drains:110; Blood:50] Intake/Output this shift: No intake/output data recorded.  Abdomen soft, drain serosang without bile Lab Results:   Recent Labs  08/18/15 0312 08/19/15 0517  WBC 22.8* 20.9*  HGB 15.5 14.3  HCT 47.5 44.1  PLT 210 186   BMET  Recent Labs  08/18/15 0312 08/19/15 0517  NA 136 136  K 4.1 4.1  CL 103 103  CO2 22 22  GLUCOSE 144* 108*  BUN 15 16  CREATININE 1.07 0.96  CALCIUM 8.7* 8.4*   PT/INR No results for input(s): LABPROT, INR in the last 72 hours. ABG No results for input(s): PHART, HCO3 in the last 72 hours.  Invalid input(s): PCO2, PO2  Studies/Results: Ct Abdomen Pelvis W Contrast  Result Date: 08/17/2015 CLINICAL DATA:  Postoperative right upper quadrant abdominal pain. EXAM: CT ABDOMEN AND PELVIS WITH CONTRAST TECHNIQUE: Multidetector CT imaging of the abdomen and pelvis was performed using the standard protocol following bolus administration of intravenous contrast. CONTRAST:  100mL ISOVUE-300 IOPAMIDOL (ISOVUE-300) INJECTION 61% COMPARISON:  None. 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 Mild bilateral posterior basilar subsegmental atelectasis is noted. Cholelithiasis is noted with gallbladder wall thickening concerning for cholecystitis. Fatty infiltration of the liver is noted. The spleen and pancreas are unremarkable. Adrenal glands appear normal. Bilateral renal cysts are noted. No hydronephrosis or renal obstruction is noted. Atherosclerosis  of abdominal aorta is noted without aneurysm formation. Mild pneumoperitoneum is noted consistent with recent postoperative status. There is no evidence of bowel obstruction. The appendix appears normal. Fluid is noted in the gallbladder fossa concerning for inflammation or postoperative status. Urinary bladder appears normal. No significant adenopathy is noted. IMPRESSION: Fatty infiltration of the liver. Aortic atherosclerosis. Coronary artery calcifications are noted suggesting coronary artery disease. Cholelithiasis is noted with gallbladder wall thickening concerning for cholecystitis. Pneumoperitoneum is noted consistent with recent postoperative status. Fluid is noted inferior to gallbladder which may be related to inflammation or postoperative status is well. Electronically Signed   By: Lupita RaiderJames  Green Jr, M.D.   On: 08/17/2015 19:28   Dg Chest Portable 1 View  Result Date: 08/17/2015 CLINICAL DATA:  Right-sided chest pain for 2 hours, history of cholecystectomy yesterday EXAM: PORTABLE CHEST 1 VIEW COMPARISON:  12/11/2014 FINDINGS: Cardiac shadow is stable. Mild bibasilar atelectasis is noted left slightly greater than right. No pneumothorax is seen. No other focal abnormality is noted. IMPRESSION: Mild bibasilar atelectasis as described. Electronically Signed   By: Alcide CleverMark  Lukens M.D.   On: 08/17/2015 17:29    Anti-infectives: Anti-infectives    Start     Dose/Rate Route Frequency Ordered Stop   08/18/15 0200  piperacillin-tazobactam (ZOSYN) IVPB 3.375 g     3.375 g 12.5 mL/hr over 240 Minutes Intravenous Every 8 hours 08/17/15 2251     08/17/15 2000  piperacillin-tazobactam (ZOSYN) IVPB 3.375 g     3.375 g 100 mL/hr over 30 Minutes Intravenous  Once 08/17/15 1950 08/17/15 2112      Assessment/Plan: s/p Procedure(s): LAPAROSCOPIC CHOLECYSTECTOMY (N/Vincent)  Continue IV  antibiotics Advance diet Decrease IVF  LOS: 2 days    Adrian Vincent 08/19/2015

## 2015-08-20 LAB — CBC
HCT: 43.1 % (ref 39.0–52.0)
Hemoglobin: 14 g/dL (ref 13.0–17.0)
MCH: 30.3 pg (ref 26.0–34.0)
MCHC: 32.5 g/dL (ref 30.0–36.0)
MCV: 93.3 fL (ref 78.0–100.0)
PLATELETS: 186 10*3/uL (ref 150–400)
RBC: 4.62 MIL/uL (ref 4.22–5.81)
RDW: 14.7 % (ref 11.5–15.5)
WBC: 16.1 10*3/uL — ABNORMAL HIGH (ref 4.0–10.5)

## 2015-08-20 MED ORDER — OXYCODONE-ACETAMINOPHEN 5-325 MG PO TABS: 1.0000 | ORAL_TABLET | ORAL | 0 refills | Status: AC | PRN

## 2015-08-20 NOTE — Discharge Instructions (Signed)
Ok to shower  No lifting more than 15 pounds for 2 weeks

## 2015-08-20 NOTE — Progress Notes (Signed)
Patient ID: Modena JanskyJack W Wolfley, male   DOB: 01/03/1951, 65 y.o.   MRN: [id number]   Doing well No complaints abd soft  Plan: Discharge home with JP

## 2015-08-20 NOTE — Progress Notes (Signed)
Drain  and dressing care taught with teach back

## 2015-08-20 NOTE — Discharge Summary (Signed)
Physician Discharge Summary  Patient ID: Adrian Vincent MRN: [id number] DOB/AGE: 65/07/1950 65 y.o.  Admit date: 08/17/2015 Discharge date: 08/20/2015  Admission Diagnoses:  Discharge Diagnoses:  Active Problems:   Cholecystitis with cholelithiasis   Discharged Condition: good  Hospital Course: uneventful post op recovery.  Discharged home with drain POD #2  Consults: None  Significant Diagnostic Studies:   Treatments: surgery: lap chole  Discharge Exam: Blood pressure 126/63, pulse 84, temperature 99.2 F (37.3 C), resp. rate 18, height 6\' 3"  (1.905 m), weight 99.4 kg (219 lb 3.2 oz), SpO2 96 %. General appearance: alert, cooperative and no distress Resp: clear to auscultation bilaterally Cardio: regular rate and rhythm, S1, S2 normal, no murmur, click, rub or gallop Incision/Wound: abdomens soft, minimally tender, drain serosang without bile  Disposition: 01-Home or Self Care     Medication List    TAKE these medications   aspirin 325 MG tablet Take 325 mg by mouth daily.   atorvastatin 40 MG tablet Commonly known as:  LIPITOR Take 40 mg by mouth daily.   ciprofloxacin 500 MG tablet Commonly known as:  CIPRO Take 1 tablet (500 mg total) by mouth 2 (two) times daily.   omeprazole 40 MG capsule Commonly known as:  PRILOSEC Take 40 mg by mouth daily.   oxyCODONE-acetaminophen 5-325 MG tablet Commonly known as:  ROXICET Take 1-2 tablets by mouth every 4 (four) hours as needed for severe pain.      Follow-up Information    Arienna Benegas A, MD. Schedule an appointment as soon as possible for a visit on 08/23/2015.   Specialty:  General Surgery Why:  for post op check and drain removal. ask for my nurse Chimera Contact information: [address] [place] [address] [phone]           Signed: Shelly RubensteinBLACKMAN,Atticus Lemberger A 08/20/2015, 7:42 AM

## 2015-08-20 NOTE — Progress Notes (Signed)
Adrian Vincent to be D/C'd  per MD order. Discussed with the patient and all questions fully answered.  VSS, Skin clean, dry and intact without evidence of skin break down, no evidence of skin tears noted.  IV catheter discontinued intact. Site without signs and symptoms of complications. Dressing and pressure applied.  An After Visit Summary was printed and given to the patient. Patient received prescription.  D/c education completed with patient/family including follow up instructions, medication list, d/c activities limitations if indicated, with other d/c instructions as indicated by MD - patient able to verbalize understanding, all questions fully answered.   Patient instructed to return to ED, call 911, or call MD for any changes in condition.   Patient to be escorted via WC, and D/C home via private auto.

## 2015-08-21 ENCOUNTER — Encounter (HOSPITAL_COMMUNITY): Payer: Self-pay | Admitting: Surgery

## 2015-10-26 ENCOUNTER — Other Ambulatory Visit: Payer: Self-pay | Admitting: Surgery

## 2015-10-26 DIAGNOSIS — R109 Unspecified abdominal pain: Secondary | ICD-10-CM

## 2015-11-06 ENCOUNTER — Ambulatory Visit
Admission: RE | Admit: 2015-11-06 | Discharge: 2015-11-06 | Disposition: A | Discharge status: Home / Self Care | Payer: BLUE CROSS/BLUE SHIELD | Source: Ambulatory Visit | Attending: Surgery | Admitting: Surgery

## 2015-11-06 ENCOUNTER — Other Ambulatory Visit: Payer: Self-pay | Admitting: Surgery

## 2015-11-06 DIAGNOSIS — R109 Unspecified abdominal pain: Secondary | ICD-10-CM

## 2015-11-15 ENCOUNTER — Other Ambulatory Visit: Payer: Self-pay | Admitting: Surgery

## 2015-11-24 ENCOUNTER — Other Ambulatory Visit: Payer: Self-pay | Admitting: Surgery

## 2015-11-24 DIAGNOSIS — R109 Unspecified abdominal pain: Secondary | ICD-10-CM

## 2015-12-06 ENCOUNTER — Telehealth: Payer: Self-pay

## 2015-12-06 NOTE — Telephone Encounter (Signed)
13-hour prep called in to pt's [facility].  Wife aware of dosing instructions (8p tonight, 2a 12/07/15 and 8a 12/07/15 along with Benadryl 50mg  at 8a).  jkl

## 2015-12-07 ENCOUNTER — Ambulatory Visit
Admission: RE | Admit: 2015-12-07 | Discharge: 2015-12-07 | Disposition: A | Discharge status: Home / Self Care | Payer: BLUE CROSS/BLUE SHIELD | Source: Ambulatory Visit | Attending: Surgery | Admitting: Surgery

## 2015-12-07 DIAGNOSIS — R109 Unspecified abdominal pain: Secondary | ICD-10-CM

## 2015-12-07 MED ORDER — IOPAMIDOL (ISOVUE-300) INJECTION 61%
100.0000 mL | Freq: Once | INTRAVENOUS | Status: AC | PRN
  Administered 2015-12-07: 100 mL via INTRAVENOUS

## 2016-05-21 ENCOUNTER — Emergency Department (HOSPITAL_COMMUNITY)
Admission: EM | Admit: 2016-05-21 | Discharge: 2016-05-21 | Disposition: A | Discharge status: Home / Self Care | Payer: BLUE CROSS/BLUE SHIELD | Attending: Emergency Medicine | Admitting: Emergency Medicine

## 2016-05-21 ENCOUNTER — Encounter (HOSPITAL_COMMUNITY): Payer: Self-pay | Admitting: Nurse Practitioner

## 2016-05-21 ENCOUNTER — Ambulatory Visit
Admission: RE | Admit: 2016-05-21 | Discharge: 2016-05-21 | Disposition: A | Discharge status: Home / Self Care | Payer: BLUE CROSS/BLUE SHIELD | Source: Ambulatory Visit | Attending: Internal Medicine | Admitting: Internal Medicine

## 2016-05-21 ENCOUNTER — Other Ambulatory Visit: Payer: Self-pay | Admitting: Internal Medicine

## 2016-05-21 DIAGNOSIS — I6200 Nontraumatic subdural hemorrhage, unspecified: Secondary | ICD-10-CM | POA: Diagnosis not present

## 2016-05-21 DIAGNOSIS — I10 Essential (primary) hypertension: Secondary | ICD-10-CM

## 2016-05-21 DIAGNOSIS — S065X9A Traumatic subdural hemorrhage with loss of consciousness of unspecified duration, initial encounter: Secondary | ICD-10-CM

## 2016-05-21 DIAGNOSIS — S065XAA Traumatic subdural hemorrhage with loss of consciousness status unknown, initial encounter: Secondary | ICD-10-CM

## 2016-05-21 DIAGNOSIS — G4452 New daily persistent headache (NDPH): Secondary | ICD-10-CM

## 2016-05-21 DIAGNOSIS — R519 Headache, unspecified: Secondary | ICD-10-CM

## 2016-05-21 DIAGNOSIS — F17210 Nicotine dependence, cigarettes, uncomplicated: Secondary | ICD-10-CM | POA: Diagnosis not present

## 2016-05-21 DIAGNOSIS — Z7982 Long term (current) use of aspirin: Secondary | ICD-10-CM | POA: Diagnosis not present

## 2016-05-21 DIAGNOSIS — R51 Headache: Secondary | ICD-10-CM | POA: Diagnosis present

## 2016-05-21 LAB — CBC WITH DIFFERENTIAL/PLATELET
BASOS ABS: 0 10*3/uL (ref 0.0–0.1)
BASOS PCT: 0 %
EOS ABS: 0.5 10*3/uL (ref 0.0–0.7)
EOS PCT: 5 %
HCT: 46.1 % (ref 39.0–52.0)
Hemoglobin: 15.3 g/dL (ref 13.0–17.0)
Lymphocytes Relative: 25 %
Lymphs Abs: 2.6 10*3/uL (ref 0.7–4.0)
MCH: 30.8 pg (ref 26.0–34.0)
MCHC: 33.2 g/dL (ref 30.0–36.0)
MCV: 92.9 fL (ref 78.0–100.0)
MONO ABS: 0.9 10*3/uL (ref 0.1–1.0)
Monocytes Relative: 9 %
Neutro Abs: 6.3 10*3/uL (ref 1.7–7.7)
Neutrophils Relative %: 61 %
PLATELETS: 195 10*3/uL (ref 150–400)
RBC: 4.96 MIL/uL (ref 4.22–5.81)
RDW: 14.2 % (ref 11.5–15.5)
WBC: 10.3 10*3/uL (ref 4.0–10.5)

## 2016-05-21 LAB — BASIC METABOLIC PANEL
ANION GAP: 8 (ref 5–15)
BUN: 16 mg/dL (ref 6–20)
CALCIUM: 9.3 mg/dL (ref 8.9–10.3)
CO2: 24 mmol/L (ref 22–32)
Chloride: 106 mmol/L (ref 101–111)
Creatinine, Ser: 1.03 mg/dL (ref 0.61–1.24)
GFR calc Af Amer: >60 mL/min (ref >60)
GLUCOSE: 94 mg/dL (ref 65–99)
Potassium: 3.8 mmol/L (ref 3.5–5.1)
SODIUM: 138 mmol/L (ref 135–145)

## 2016-05-21 MED ORDER — BUTALBITAL-APAP-CAFFEINE 50-325-40 MG PO TABS: 1.0000 | ORAL_TABLET | Freq: Four times a day (QID) | ORAL | 0 refills | Status: AC | PRN

## 2016-05-21 MED ORDER — LEVETIRACETAM 500 MG PO TABS: 500.0000 mg | ORAL_TABLET | Freq: Two times a day (BID) | ORAL | 0 refills | Status: AC

## 2016-05-21 NOTE — Consult Note (Signed)
CC:  Chief Complaint  Patient presents with  . Headache    HPI: Adrian Vincent is a 66 y.o. male who presented to ER via private vehicle after his PCP ordered a CT scan of head d/t headaches and saw a small subdural hematoma. Patient reports mild, dull ache, frontal headaches for the past 1 month. Initially he was taking Aspirin which was helping with the headaches up until about 10 days ago, which is what lead to his PCP ordering of the CT scan.  There has been no worsening of the headache.  He cannot recall any injury to his head, although states its possible he hit his head when getting in/out of the car (as it does frequently happen) and just didn't recognize it as a serious injury.  He is not on anti-coagulation with the exception of taking ASA for his headache. Does endorse trigger points in his neck that his wife has been massaging which helps with the pain. He denies radicular symptoms. Neg fever, photophobia, neuro deficits. Feels well otherwise. History of ACDF roughly 10 years ago. HA never wakes him up from sleep.   PMH: Past Medical History:  Diagnosis Date  . Dental bridge present    upper  . GERD (gastroesophageal reflux disease)   . Hypercholesteremia   . RBBB   . Symptomatic cholelithiasis 08/2015  . Wears partial dentures    upper and lower    PSH: Past Surgical History:  Procedure Laterality Date  . CHOLECYSTECTOMY N/A 08/18/2015   Procedure: LAPAROSCOPIC CHOLECYSTECTOMY;  Surgeon: Abigail Miyamotoouglas [name], MD;  [facility];  Service: General;  Laterality: N/A;  . LAPAROSCOPY N/A 08/16/2015   Procedure: DIAGNOSTIC LAPAROSCOPY;  Surgeon: Abigail Miyamotoouglas [name], MD;  Location: [facility];  Service: General;  Laterality: N/A;  . THORACIC DISC SURGERY     exc. bone spur    SH: Social History  Substance Use Topics  . Smoking status: Current Every Day Smoker    Packs/day: 0.50    Years: 40.00    Types: Cigarettes  . Smokeless tobacco: Never Used  . Alcohol use  Yes     Comment: moderately    MEDS: Prior to Admission medications   Medication Sig Start Date End Date Taking? Authorizing Provider  aspirin 325 MG tablet Take 325 mg by mouth every 6 (six) hours as needed for mild pain or headache.    Yes [provider]  atorvastatin (LIPITOR) 40 MG tablet Take 40 mg by mouth daily.   Yes [provider]  omeprazole (PRILOSEC) 40 MG capsule Take 40 mg by mouth daily.   Yes [provider]  traMADol (ULTRAM) 50 MG tablet Take 50 mg by mouth every 6 (six) hours as needed. 05/21/16  Yes [provider]  ciprofloxacin (CIPRO) 500 MG tablet Take 1 tablet (500 mg total) by mouth 2 (two) times daily. Patient not taking: Reported on 05/21/2016 08/16/15   Abigail MiyamotoBlackman, Douglas, MD  oxyCODONE-acetaminophen (ROXICET) 5-325 MG tablet Take 1-2 tablets by mouth every 4 (four) hours as needed for severe pain. Patient not taking: Reported on 05/21/2016 08/20/15   Abigail MiyamotoBlackman, Douglas, MD    ALLERGY: Allergies  Allergen Reactions  . Iodinated Diagnostic Agents Hives and Rash    ROS: Review of Systems  Constitutional: Negative for chills, diaphoresis, fever, malaise/fatigue and weight loss.  HENT: Negative.   Eyes: Negative for blurred vision, double vision and photophobia.  Gastrointestinal: Negative.  Negative for nausea and vomiting.  Genitourinary: Negative.   Musculoskeletal: Positive  for neck pain (trigger points right side). Negative for back pain, falls, joint pain and myalgias.  Skin: Negative.   Neurological: Negative for dizziness, tingling, tremors, sensory change, speech change, focal weakness, seizures, weakness and headaches.  Endo/Heme/Allergies: Negative.     Vitals:   05/21/16 1554  BP: (!) 148/95  Pulse: 78  Resp: 17  Temp: 98.6 F (37 C)   General appearance: WDWN, NAD, Nontoxic in appearance Eyes: PERRL, Fundoscopic: normal Cardiovascular: Regular rate and rhythm without murmurs, rubs, gallops. No edema or  variciosities. Distal pulses normal. Pulmonary: Clear to auscultation Musculoskeletal:     Muscle tone upper extremities: Normal    Muscle tone lower extremities: Normal    Motor exam: Upper Extremities Deltoid Bicep Tricep Grip  Right 5/5 5/5 5/5 5/5  Left 5/5 5/5 5/5 5/5   Lower Extremity IP Quad PF DF EHL  Right 5/5 5/5 5/5 5/5 5/5  Left 5/5 5/5 5/5 5/5 5/5   Neurological Awake, alert, oriented Memory and concentration grossly intact Speech fluent, appropriate CNII: Visual fields normal CNIII/IV/VI: EOMI CNV: Facial sensation normal CNVII: Symmetric, normal strength CNVIII: Grossly normal CNIX: Normal palate movement CNXI: Trap and SCM strength normal CN XII: Tongue protrusion normal Sensation grossly intact to LT DTR: Normal Coordination (finger/nose & heel/shin): Normal Negative Brudzinski/Kernig  IMAGING: CT HEAD IMPRESSION: Acute subdural hematoma on the left involving portions of the left posterior frontal, left temporal, and left temporoparietal regions with localized mass effect on the adjacent brain but no midline shift. Maximum thickness of subdural hematoma on the left is 1.0 cm along its posterior aspect. No intra-axial hemorrhage or mass. Gray-white compartments appear normal. Foci of atherosclerotic vascular calcification. Areas of paranasal sinus disease. Benign osteoma left ethmoid sinus region.  IMPRESSION/PLAN - 66 y.o. male with small SDH without midline shift or ventriculomegaly. He is neurologically intact and moves all extremities with normal strength. I have discussed the case with Dr Conchita Paris who has also reviewed the imaging. No surgical intervention indicated. There is no need to keep patient here since his headache has been going on for roughly 1 month and he has not had any acute worsening, and I believe it is safe to assume the SDH was present which led to the HAs initially. He can follow up with [place] outpt. He was given strong precautions for  when to seek urgent medical attention. I have started him on Keppra 500mg  BID x7days for seizure prophylaxis. We will rx Fioricet for his headache. Please call for any concerns.

## 2016-05-21 NOTE — ED Triage Notes (Signed)
Pt presents with c/o abnormal imagining test today. He was sent for subdural hematoma found on CT scan. His PCP ordered the CT scan for a 1 month history of left sided headaches. He denies any head injury. He denies any loss of consciousness, confusion, numbness, dizziness, lightheadedness, nausea, vomiting. He had been taking otc pain medication with temporary relief of the headaches over the past month

## 2016-05-21 NOTE — ED Notes (Signed)
Dr. Cardama at bedside at this time.  

## 2016-05-21 NOTE — ED Provider Notes (Signed)
[facility]-EMER[facility]ENCY DEPT Provider Note   CSN: [id number] Arrival date & time: 05/21/16  1543     History   Chief Complaint Chief Complaint  Patient presents with  . Headache    HPI Adrian Vincent is a 66 y.o. male.  The history is provided by the patient.  Headache   This is a recurrent problem. Episode onset: 1 month. Episode frequency: intermittent until 1 week ago. since it has been constant for 1 week. The problem has not changed since onset.The headache is associated with nothing. Pain location: calvarial. The quality of the pain is described as dull. The pain is moderate. The pain does not radiate. Pertinent negatives include no fever, no chest pressure, no near-syncope, no orthopnea, no palpitations, no syncope, no shortness of breath, no nausea and no vomiting. Treatments tried: ASA 1025mg .   Seen by NP at PCP's office who orded CT head that revealed left subdural hematoma.   Past Medical History:  Diagnosis Date  . Dental bridge present    upper  . GERD (gastroesophageal reflux disease)   . Hypercholesteremia   . RBBB   . Symptomatic cholelithiasis 08/2015  . Wears partial dentures    upper and lower    Patient Active Problem List   Diagnosis Date Noted  . Cholecystitis with cholelithiasis 08/17/2015    Past Surgical History:  Procedure Laterality Date  . CHOLECYSTECTOMY N/A 08/18/2015   Procedure: LAPAROSCOPIC CHOLECYSTECTOMY;  Surgeon: Abigail Miyamoto, MD;  Location: [facility] OR;  Service: General;  Laterality: N/A;  . LAPAROSCOPY N/A 08/16/2015   Procedure: DIAGNOSTIC LAPAROSCOPY;  Surgeon: Abigail Miyamoto, MD;  Location: [facility];  Service: General;  Laterality: N/A;  . THORACIC DISC SURGERY     exc. bone spur       Home Medications    Prior to Admission medications   Medication Sig Start Date End Date Taking? Authorizing Provider  aspirin 325 MG tablet Take 325 mg by mouth every 6 (six) hours as needed for mild pain or headache.    Yes  [provider]  atorvastatin (LIPITOR) 40 MG tablet Take 40 mg by mouth daily.   Yes [provider]  omeprazole (PRILOSEC) 40 MG capsule Take 40 mg by mouth daily.   Yes [provider]  traMADol (ULTRAM) 50 MG tablet Take 50 mg by mouth every 6 (six) hours as needed. 05/21/16  Yes [provider]  butalbital-acetaminophen-caffeine (FIORICET, ESGIC) [phone] MG tablet Take 1-2 tablets by mouth every 6 (six) hours as needed for headache. 05/21/16 05/21/17  Costella, Darci Current, PA-C  ciprofloxacin (CIPRO) 500 MG tablet Take 1 tablet (500 mg total) by mouth 2 (two) times daily. Patient not taking: Reported on 05/21/2016 08/16/15   Abigail Miyamoto, MD  levETIRAcetam (KEPPRA) 500 MG tablet Take 1 tablet (500 mg total) by mouth 2 (two) times daily. 05/21/16 05/28/16  Costella, Darci Current, PA-C  oxyCODONE-acetaminophen (ROXICET) 5-325 MG tablet Take 1-2 tablets by mouth every 4 (four) hours as needed for severe pain. Patient not taking: Reported on 05/21/2016 08/20/15   Abigail Miyamoto, MD    Family History History reviewed. No pertinent family history.  Social History Social History  Substance Use Topics  . Smoking status: Current Every Day Smoker    Packs/day: 0.50    Years: 40.00    Types: Cigarettes  . Smokeless tobacco: Never Used  . Alcohol use Yes     Comment: moderately     Allergies   Iodinated diagnostic agents  Review of Systems Review of Systems  Constitutional: Negative for fever.  Respiratory: Negative for shortness of breath.   Cardiovascular: Negative for palpitations, orthopnea, syncope and near-syncope.  Gastrointestinal: Negative for nausea and vomiting.  Neurological: Positive for headaches.  All other systems are reviewed and are negative for acute change except as noted in the HPI    Physical Exam Updated Vital Signs BP (!) 148/95   Pulse 78   Temp 98.6 F (37 C) (Oral)   Resp 17   SpO2 97%   Physical Exam    Constitutional: He is oriented to person, place, and time. He appears well-developed and well-nourished. No distress.  HENT:  Head: Normocephalic and atraumatic.  Nose: Nose normal.  Eyes: Conjunctivae and EOM are normal. Pupils are equal, round, and reactive to light. Right eye exhibits no discharge. Left eye exhibits no discharge. No scleral icterus.  Neck: Normal range of motion. Neck supple.  Cardiovascular: Normal rate and regular rhythm.  Exam reveals no gallop and no friction rub.   No murmur heard. Pulmonary/Chest: Effort normal and breath sounds normal. No stridor. No respiratory distress. He has no rales.  Abdominal: Soft. He exhibits no distension. There is no tenderness.  Musculoskeletal: He exhibits no edema or tenderness.  Neurological: He is alert and oriented to person, place, and time.  Mental Status: Alert and oriented to person, place, and time. Attention and concentration normal. Speech clear. Recent memory is intact  Cranial Nerves  II Visual Fields: Intact to confrontation. Visual fields intact. III, IV, VI: Pupils equal and reactive to light and near. Full eye movement without nystagmus  V Facial Sensation: Normal. No weakness of masticatory muscles  VII: No facial weakness or asymmetry  VIII Auditory Acuity: Grossly normal  IX/X: The uvula is midline; the palate elevates symmetrically  XI: Normal sternocleidomastoid and trapezius strength  XII: The tongue is midline. No atrophy or fasciculations.   Motor System: Muscle Strength: 5/5 and symmetric in the upper and lower extremities. No pronation or drift.  Muscle Tone: Tone and muscle bulk are normal in the upper and lower extremities.   Reflexes: DTRs: 2+ and symmetrical in all four extremities. Plantar responses are flexor bilaterally.  Coordination: Intact finger-to-nose, heel-to-shin. No tremor.  Sensation: Intact to light touch, and pinprick. Negative Romberg test.  Gait: Routine and tandem gait are normal     Skin: Skin is warm and dry. No rash noted. He is not diaphoretic. No erythema.  Psychiatric: He has a normal mood and affect.  Vitals reviewed.    ED Treatments / Results  Labs (all labs ordered are listed, but only abnormal results are displayed) Labs Reviewed  CBC WITH DIFFERENTIAL/PLATELET  BASIC METABOLIC PANEL    EKG  EKG Interpretation None       Radiology Ct Head Wo Contrast  Result Date: 05/21/2016 CLINICAL DATA:  Headache for 10 days EXAM: CT HEAD WITHOUT CONTRAST TECHNIQUE: Contiguous axial images were obtained from the base of the skull through the vertex without intravenous contrast. COMPARISON:  None. FINDINGS: Brain: There is an acute appearing subdural hematoma on the left, seen involving portions of the posterior left frontal lobe, superior left temporal lobe, and left temporoparietal junction. This subdural hematoma is thickest at the temporoparietal junction on the left measuring 1.0 cm in thickness in this area. There is mild localized mass effect on the brain, particularly at the left temporoparietal junction. There is no midline shift, however. Ventricles are normal in size and configuration. There is no  intra-axial mass or intra-axial hemorrhage. Gray-white compartments appear unremarkable without evidence of acute infarct. Vascular: No hyperdense vessel. There is calcification in each carotid siphon region. Skull: Bony calvarium appears intact. Sinuses/Orbits: There is a benign-appearing osteoma in the anterior left ethmoid complex region. There is mucosal thickening in the inferior left frontal sinus as well as in several ethmoid air cells. Visualized orbits appear symmetric bilaterally. Other: Visualized mastoid air cells are clear. IMPRESSION: Acute subdural hematoma on the left involving portions of the left posterior frontal, left temporal, and left temporoparietal regions with localized mass effect on the adjacent brain but no midline shift. Maximum thickness  of subdural hematoma on the left is 1.0 cm along its posterior aspect. No intra-axial hemorrhage or mass. Gray-white compartments appear normal. Foci of atherosclerotic vascular calcification. Areas of paranasal sinus disease. Benign osteoma left ethmoid sinus region. Critical Value/emergent results were called by telephone at the time of interpretation on 05/21/2016 at 3:00 pm to Willis ModenaSue Drinkard, NP, who verbally acknowledged these results. Electronically Signed   By: Bretta BangWilliam  Woodruff III M.D.   On: 05/21/2016 15:00    Procedures Procedures (including critical care time)  Medications Ordered in ED Medications - No data to display   Initial Impression / Assessment and Plan / ED Course  I have reviewed the triage vital signs and the nursing notes.  Pertinent labs & imaging results that were available during my care of the patient were reviewed by me and considered in my medical decision making (see chart for details).  Clinical Course as of May 22 1803  Tue May 21, 2016  1801 Neurosurgery consulted who evaluated the patient in the emergency department and felt that the subdural hematoma was small. Given his lack of physical exam findings and the likely duration of the hematoma they felt that the patient was appropriate for discharge with close follow-up in their clinic. They did recommend Keppra for seizure prophylaxis. The patient is safe for discharge with strict return precautions.   [PC]    Clinical Course User Index [PC] Cardama, Amadeo GarnetPedro Eduardo, MD      Final Clinical Impressions(s) / ED Diagnoses   Final diagnoses:  SDH (subdural hematoma) (HCC)  New daily persistent headache   Disposition: Discharge  Condition: Good  I have discussed the results, Dx and Tx plan with the patient who expressed understanding and agree(s) with the plan. Discharge instructions discussed at great length. The patient was given strict return precautions who verbalized understanding of the instructions.  No further questions at time of discharge.    Current Discharge Medication List    START taking these medications   Details  butalbital-acetaminophen-caffeine (FIORICET, ESGIC) 50-325-40 MG tablet Take 1-2 tablets by mouth every 6 (six) hours as needed for headache. Qty: 30 tablet, Refills: 0    levETIRAcetam (KEPPRA) 500 MG tablet Take 1 tablet (500 mg total) by mouth 2 (two) times daily. Qty: 14 tablet, Refills: 0        Follow Up: Lisbeth RenshawNundkumar, Neelesh, MD [address] [place] [address] [phone]  Go to  as scheduled      Nira Connardama, Pedro Eduardo, MD 05/21/16 [phone]

## 2016-07-05 ENCOUNTER — Other Ambulatory Visit: Payer: Self-pay | Admitting: Physician Assistant

## 2016-07-05 DIAGNOSIS — S065X9A Traumatic subdural hemorrhage with loss of consciousness of unspecified duration, initial encounter: Secondary | ICD-10-CM

## 2016-07-05 DIAGNOSIS — S065XAA Traumatic subdural hemorrhage with loss of consciousness status unknown, initial encounter: Secondary | ICD-10-CM

## 2016-07-11 ENCOUNTER — Ambulatory Visit
Admission: RE | Admit: 2016-07-11 | Discharge: 2016-07-11 | Disposition: A | Discharge status: Home / Self Care | Payer: BLUE CROSS/BLUE SHIELD | Source: Ambulatory Visit | Attending: Physician Assistant | Admitting: Physician Assistant

## 2016-07-11 DIAGNOSIS — S065XAA Traumatic subdural hemorrhage with loss of consciousness status unknown, initial encounter: Secondary | ICD-10-CM

## 2016-07-11 DIAGNOSIS — S065X9A Traumatic subdural hemorrhage with loss of consciousness of unspecified duration, initial encounter: Secondary | ICD-10-CM

## 2016-08-17 IMAGING — CR DG CHEST 1V PORT
1 series · 1 of 1 positions shown · non-contrast
Comparison: 12/11/2014

CLINICAL DATA: Right-sided chest pain for 2 hours, history of
cholecystectomy yesterday

EXAM:
PORTABLE CHEST 1 VIEW

[portable]
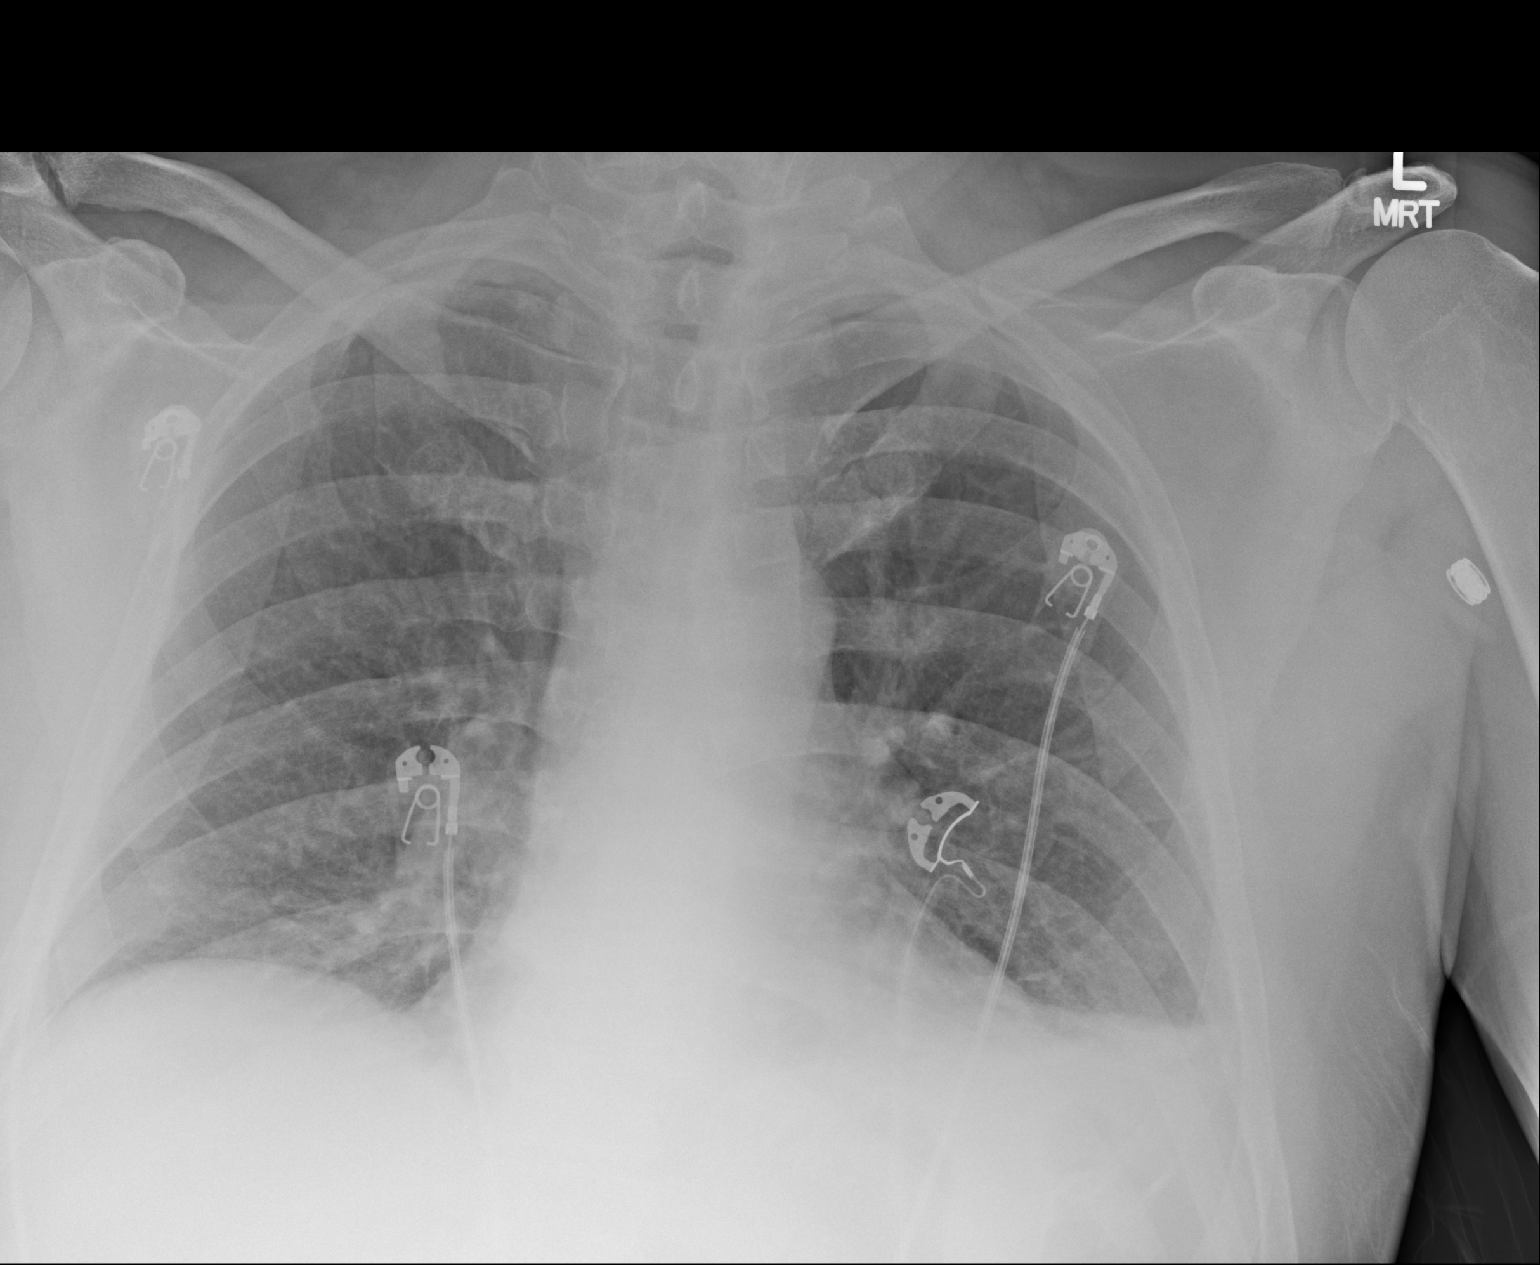

[1 of 1 positions shown; findings below may reference images not displayed]

FINDINGS: Cardiac shadow is stable. Mild bibasilar atelectasis is noted left
slightly greater than right. No pneumothorax is seen. No other focal
abnormality is noted.
IMPRESSION: Mild bibasilar atelectasis as described.

## 2016-11-20 ENCOUNTER — Other Ambulatory Visit: Payer: Self-pay | Admitting: Surgery

## 2016-11-20 ENCOUNTER — Ambulatory Visit
Admission: RE | Admit: 2016-11-20 | Discharge: 2016-11-20 | Disposition: A | Discharge status: Home / Self Care | Payer: BLUE CROSS/BLUE SHIELD | Source: Ambulatory Visit | Attending: Physician Assistant | Admitting: Physician Assistant

## 2016-11-20 ENCOUNTER — Other Ambulatory Visit: Payer: Self-pay | Admitting: Physician Assistant

## 2016-11-20 ENCOUNTER — Ambulatory Visit
Admission: RE | Admit: 2016-11-20 | Discharge: 2016-11-20 | Disposition: A | Discharge status: Home / Self Care | Payer: BLUE CROSS/BLUE SHIELD | Source: Ambulatory Visit | Attending: Surgery | Admitting: Surgery

## 2016-11-20 DIAGNOSIS — S065X9A Traumatic subdural hemorrhage with loss of consciousness of unspecified duration, initial encounter: Secondary | ICD-10-CM

## 2016-11-20 DIAGNOSIS — S065XAA Traumatic subdural hemorrhage with loss of consciousness status unknown, initial encounter: Secondary | ICD-10-CM

## 2016-11-20 DIAGNOSIS — R109 Unspecified abdominal pain: Secondary | ICD-10-CM

## 2017-11-21 IMAGING — CT CT HEAD W/O CM
3 of 4 series · 16 of 47 positions shown, 19 images · non-contrast
Comparison: 07/11/2016

CLINICAL DATA: Aches for 6 months.  History of subdural hematoma.

EXAM:
CT HEAD WITHOUT CONTRAST
TECHNIQUE: Contiguous axial images were obtained from the base of the skull
through the vertex without intravenous contrast.

[Series 33: 3d filtered head w/o · axial · non-contrast · 0.49mm/px · z∈[-15,+125]mm · 10 of 34 slices shown, 13 images]
[im 3/34  brain]
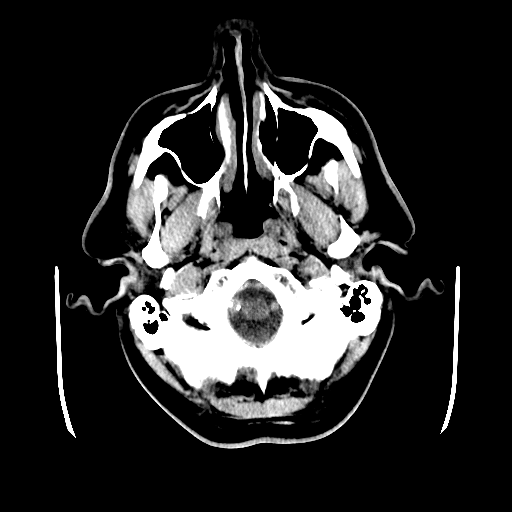
[im 3/34  bone]
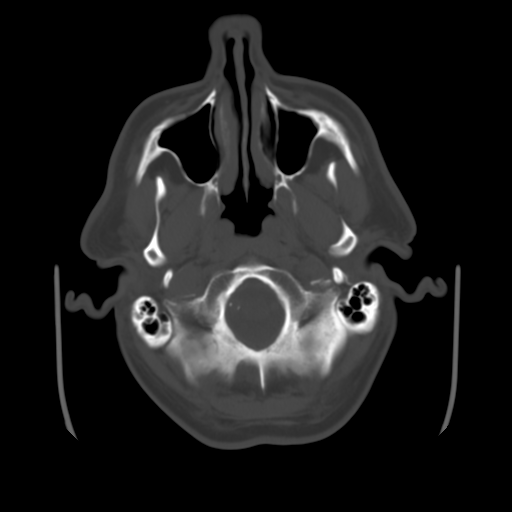
[im 5/34  brain]
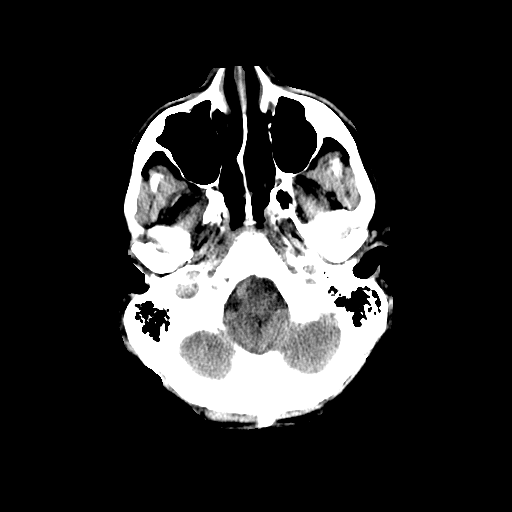
[im 10/34  brain]
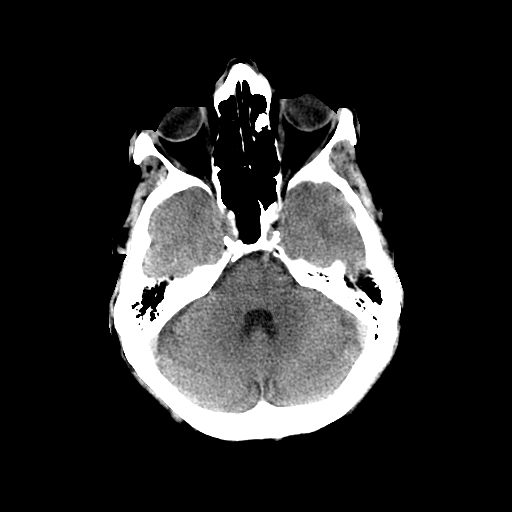
[im 12/34  brain]
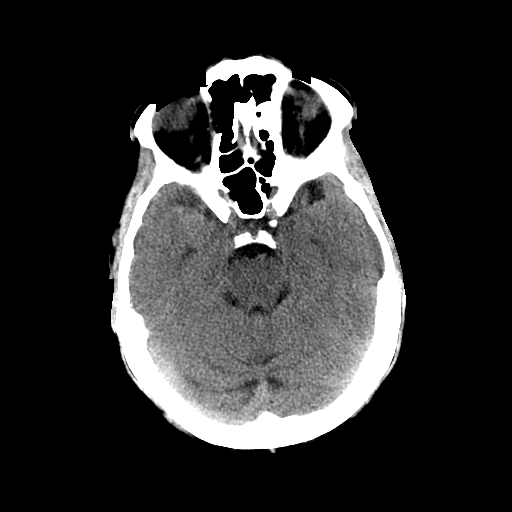
[im 15/34  brain]
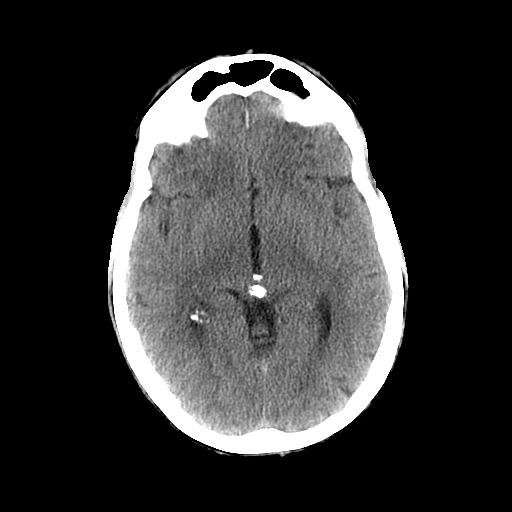
[im 15/34  bone]
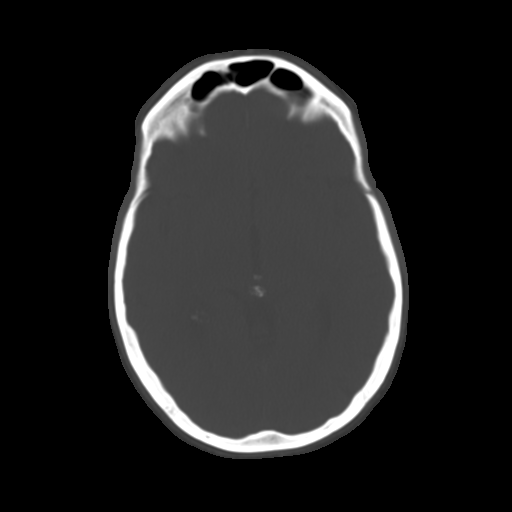
[im 19/34  brain]
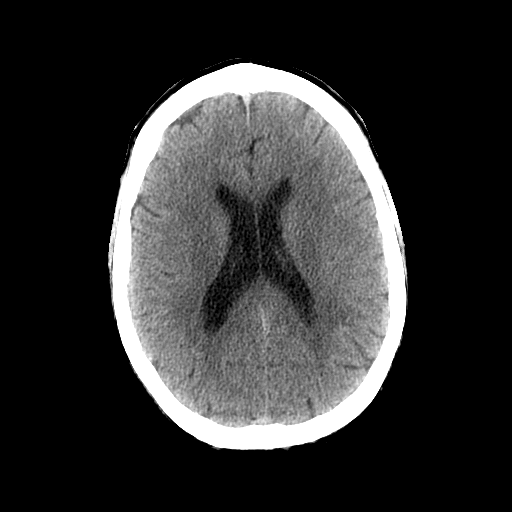
[im 22/34  brain]
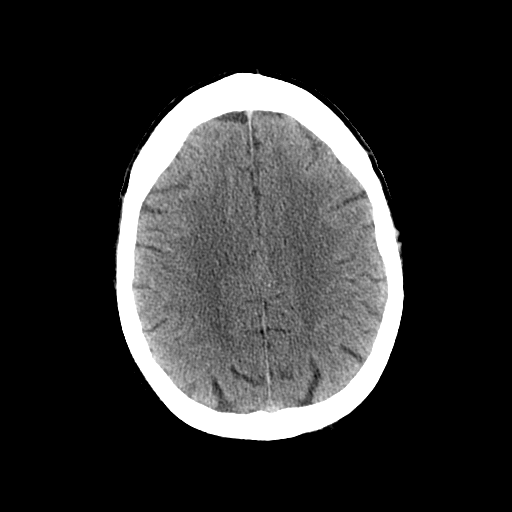
[im 24/34  brain]
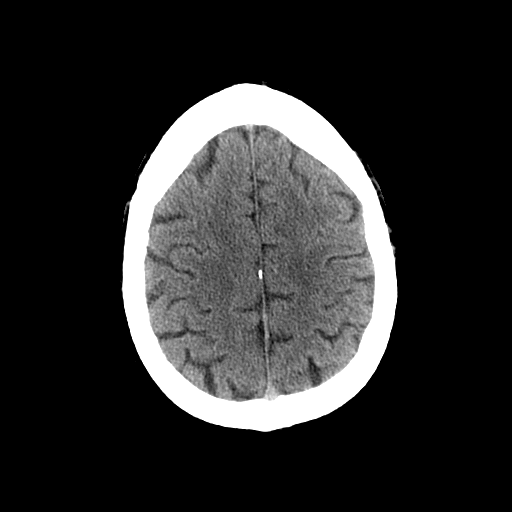
[im 29/34  brain]
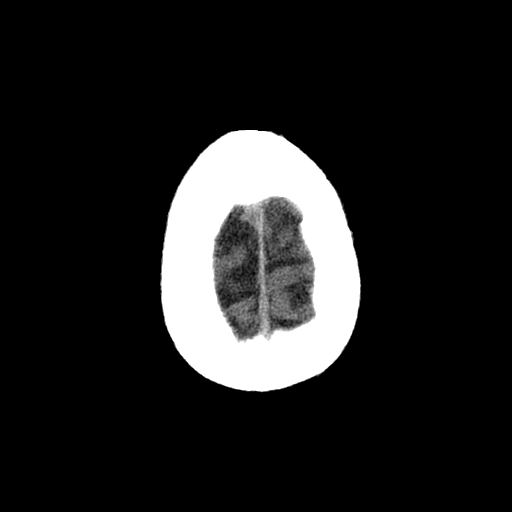
[im 29/34  bone]
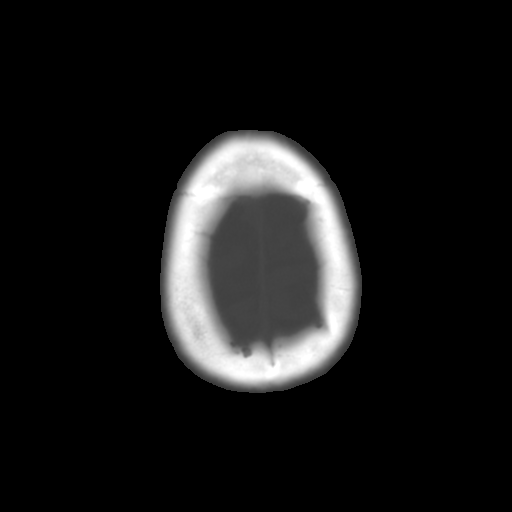
[im 31/34  brain]
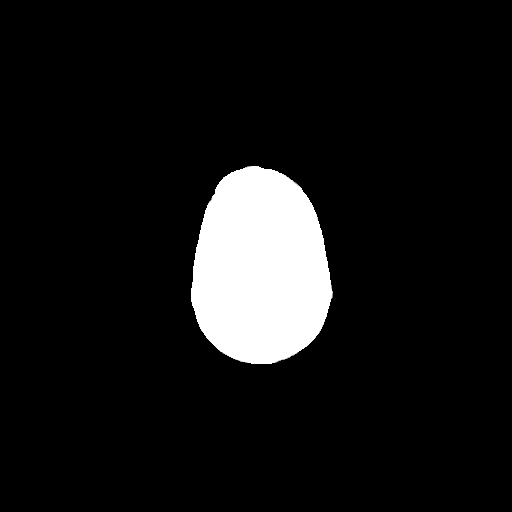

[Series 601: coronal brain · coronal · 0.49mm/px · 3 of 77 slices shown]
[im 26/77  brain]
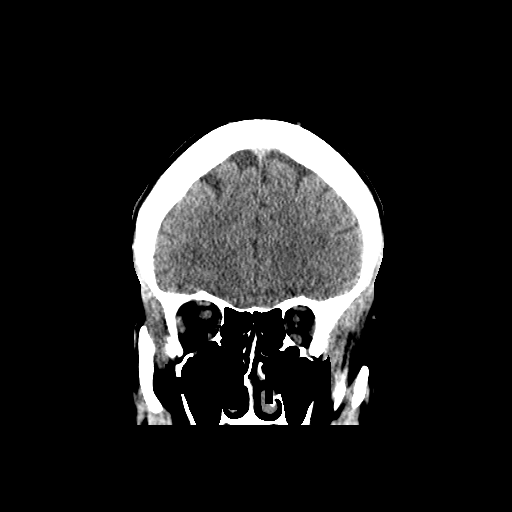
[im 34/77  brain]
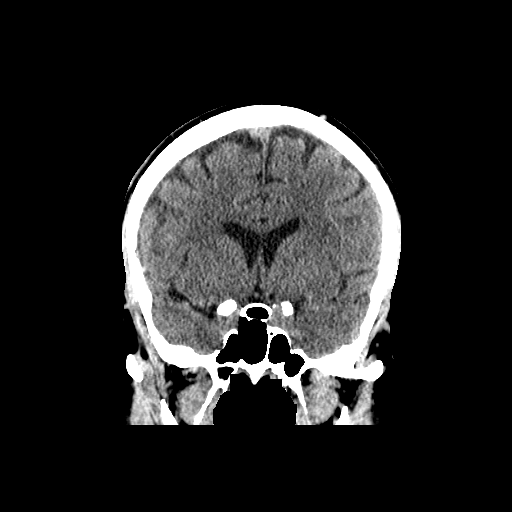
[im 43/77  brain]
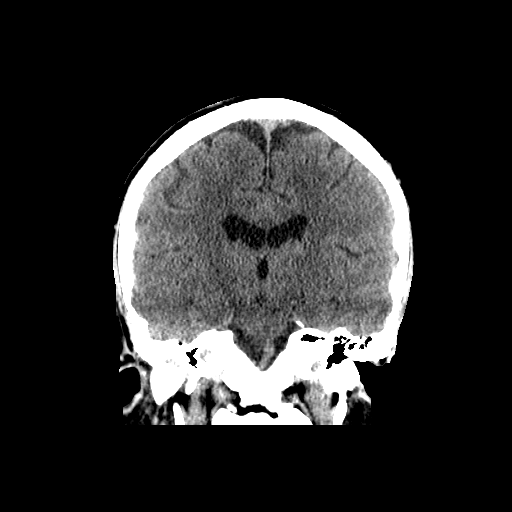

[Series 602: sagittal brain · sagittal · 0.49mm/px · 3 of 65 slices shown]
[im 22/65  brain]
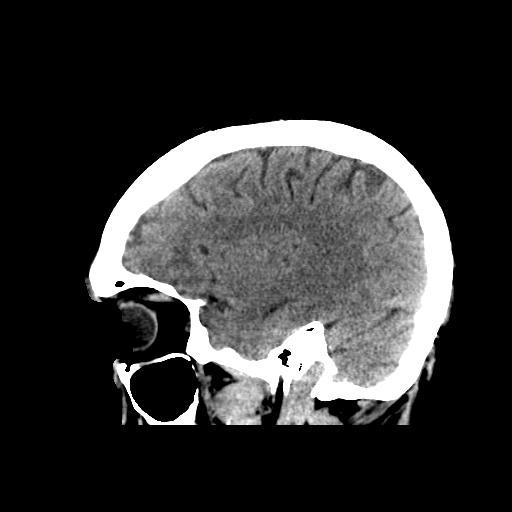
[im 33/65  brain]
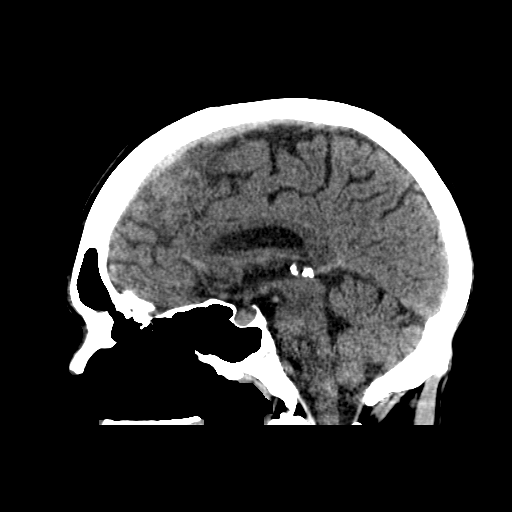
[im 43/65  brain]
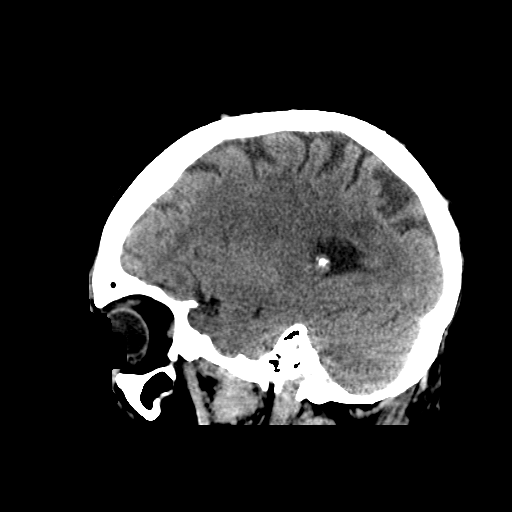

[16 of 47 positions shown; findings below may reference images not displayed]

FINDINGS: Brain: The small subdural hematoma over the left cerebral convexity
on the prior study has resolved. There is no evidence of new
intracranial hemorrhage, acute infarct, mass, or midline shift. The
ventricles and sulci are normal in size. Cerebral white matter
hypodensities are similar to the prior study and nonspecific but
compatible with mild chronic small vessel ischemic disease.

Vascular: Calcified atherosclerosis at the skullbase. No hyperdense
vessel.

Skull: No fracture focal osseous lesion.

Sinuses/Orbits: Anterior left ethmoid sinus osteoma. No evidence of
acute sinusitis. Clear mastoid air cells. Unremarkable orbits.

Other: None.
IMPRESSION: 1. Interval resolution of left-sided subdural hematoma.
2. No evidence of acute intracranial abnormality.
3. Mild chronic small vessel ischemic disease.
